# Patient Record
Sex: Female | Born: 1935 | Race: White | Hispanic: No | Marital: Single | Smoking: Never smoker
Health system: Southern US, Community
[De-identification: ages and names within clinical notes are randomized; demographics above are authoritative.]

## PROBLEM LIST (undated history)

## (undated) DIAGNOSIS — J45909 Unspecified asthma, uncomplicated: Secondary | ICD-10-CM

## (undated) DIAGNOSIS — H9190 Unspecified hearing loss, unspecified ear: Secondary | ICD-10-CM

## (undated) DIAGNOSIS — Z972 Presence of dental prosthetic device (complete) (partial): Secondary | ICD-10-CM

## (undated) DIAGNOSIS — M1990 Unspecified osteoarthritis, unspecified site: Secondary | ICD-10-CM

## (undated) DIAGNOSIS — F419 Anxiety disorder, unspecified: Secondary | ICD-10-CM

## (undated) HISTORY — PX: ORIF HIP FRACTURE: SHX2125

## (undated) HISTORY — PX: TOTAL HIP ARTHROPLASTY: SHX124

## (undated) HISTORY — PX: COLONOSCOPY: SHX174

## (undated) HISTORY — PX: APPENDECTOMY: SHX54

---

## 2004-08-30 ENCOUNTER — Ambulatory Visit: Payer: Self-pay | Admitting: Internal Medicine

## 2005-08-17 ENCOUNTER — Inpatient Hospital Stay: Payer: Self-pay | Admitting: Internal Medicine

## 2005-08-17 ENCOUNTER — Other Ambulatory Visit: Payer: Self-pay

## 2005-09-25 ENCOUNTER — Ambulatory Visit: Payer: Self-pay | Admitting: Internal Medicine

## 2005-11-05 ENCOUNTER — Ambulatory Visit: Payer: Self-pay

## 2006-07-19 ENCOUNTER — Ambulatory Visit: Payer: Self-pay | Admitting: Internal Medicine

## 2006-10-04 ENCOUNTER — Ambulatory Visit: Payer: Self-pay | Admitting: Internal Medicine

## 2007-03-31 ENCOUNTER — Inpatient Hospital Stay: Payer: Self-pay | Admitting: Internal Medicine

## 2007-05-19 ENCOUNTER — Ambulatory Visit: Payer: Self-pay | Admitting: Internal Medicine

## 2007-06-10 ENCOUNTER — Ambulatory Visit: Payer: Self-pay | Admitting: Gynecologic Oncology

## 2007-06-27 ENCOUNTER — Ambulatory Visit: Payer: Self-pay | Admitting: Unknown Physician Specialty

## 2007-10-07 ENCOUNTER — Ambulatory Visit: Payer: Self-pay | Admitting: Internal Medicine

## 2008-01-27 ENCOUNTER — Ambulatory Visit: Payer: Self-pay | Admitting: Gynecologic Oncology

## 2008-10-07 ENCOUNTER — Ambulatory Visit: Payer: Self-pay | Admitting: Internal Medicine

## 2010-11-23 ENCOUNTER — Ambulatory Visit: Payer: Self-pay | Admitting: Internal Medicine

## 2011-03-06 ENCOUNTER — Ambulatory Visit: Payer: Self-pay | Admitting: Urology

## 2011-12-25 ENCOUNTER — Ambulatory Visit: Payer: Self-pay | Admitting: Internal Medicine

## 2012-06-02 ENCOUNTER — Ambulatory Visit: Payer: Self-pay | Admitting: Internal Medicine

## 2012-06-28 ENCOUNTER — Ambulatory Visit: Payer: Self-pay | Admitting: Internal Medicine

## 2012-10-23 ENCOUNTER — Ambulatory Visit: Payer: Self-pay | Admitting: Obstetrics and Gynecology

## 2012-10-31 ENCOUNTER — Ambulatory Visit: Payer: Self-pay | Admitting: Obstetrics and Gynecology

## 2012-12-19 ENCOUNTER — Inpatient Hospital Stay: Payer: Self-pay | Admitting: Orthopedic Surgery

## 2012-12-19 LAB — BASIC METABOLIC PANEL
Anion Gap: 7 (ref 7–16)
Calcium, Total: 8.7 mg/dL (ref 8.5–10.1)
Chloride: 106 mmol/L (ref 98–107)
EGFR (African American): 56 — ABNORMAL LOW
EGFR (Non-African Amer.): 48 — ABNORMAL LOW
Glucose: 118 mg/dL — ABNORMAL HIGH (ref 65–99)
Sodium: 141 mmol/L (ref 136–145)

## 2012-12-19 LAB — URINALYSIS, COMPLETE
Bacteria: NONE SEEN
Blood: NEGATIVE
Glucose,UR: NEGATIVE mg/dL (ref 0–75)
Ketone: NEGATIVE
Nitrite: NEGATIVE
Protein: NEGATIVE
RBC,UR: 2 /HPF (ref 0–5)
Specific Gravity: 1.013 (ref 1.003–1.030)
Squamous Epithelial: <1
WBC UR: 1 /HPF (ref 0–5)

## 2012-12-19 LAB — CBC
HCT: 34.8 % — ABNORMAL LOW (ref 35.0–47.0)
HGB: 11.9 g/dL — ABNORMAL LOW (ref 12.0–16.0)
MCH: 30.1 pg (ref 26.0–34.0)
MCV: 88 fL (ref 80–100)
RBC: 3.95 10*6/uL (ref 3.80–5.20)
RDW: 13 % (ref 11.5–14.5)

## 2012-12-20 ENCOUNTER — Ambulatory Visit: Payer: Self-pay | Admitting: Orthopedic Surgery

## 2012-12-20 LAB — CBC WITH DIFFERENTIAL/PLATELET
Basophil %: 0.3 %
HCT: 32.7 % — ABNORMAL LOW (ref 35.0–47.0)
Monocyte #: 0.8 x10 3/mm (ref 0.2–0.9)
Monocyte %: 7.9 %
Neutrophil #: 8.7 10*3/uL — ABNORMAL HIGH (ref 1.4–6.5)
Neutrophil %: 81.3 %
RBC: 3.73 10*6/uL — ABNORMAL LOW (ref 3.80–5.20)
WBC: 10.7 10*3/uL (ref 3.6–11.0)

## 2012-12-20 LAB — BASIC METABOLIC PANEL
BUN: 23 mg/dL — ABNORMAL HIGH (ref 7–18)
Calcium, Total: 8.3 mg/dL — ABNORMAL LOW (ref 8.5–10.1)
Chloride: 104 mmol/L (ref 98–107)
Co2: 30 mmol/L (ref 21–32)
Creatinine: 0.98 mg/dL (ref 0.60–1.30)
EGFR (African American): >60
EGFR (Non-African Amer.): 53 — ABNORMAL LOW
Glucose: 142 mg/dL — ABNORMAL HIGH (ref 65–99)
Sodium: 137 mmol/L (ref 136–145)

## 2012-12-21 LAB — CBC WITH DIFFERENTIAL/PLATELET
Eosinophil %: 1 %
HCT: 26.2 % — ABNORMAL LOW (ref 35.0–47.0)
Lymphocyte #: 0.9 10*3/uL — ABNORMAL LOW (ref 1.0–3.6)
Lymphocyte %: 10.4 %
Monocyte %: 9.1 %
Neutrophil #: 6.8 10*3/uL — ABNORMAL HIGH (ref 1.4–6.5)
Neutrophil %: 79.3 %
RBC: 2.96 10*6/uL — ABNORMAL LOW (ref 3.80–5.20)
RDW: 13.2 % (ref 11.5–14.5)
WBC: 8.5 10*3/uL (ref 3.6–11.0)

## 2012-12-21 LAB — BASIC METABOLIC PANEL
Anion Gap: 4 — ABNORMAL LOW (ref 7–16)
BUN: 16 mg/dL (ref 7–18)
Calcium, Total: 7.9 mg/dL — ABNORMAL LOW (ref 8.5–10.1)
Chloride: 106 mmol/L (ref 98–107)
Osmolality: 280 (ref 275–301)
Potassium: 3.7 mmol/L (ref 3.5–5.1)

## 2012-12-22 LAB — CBC WITH DIFFERENTIAL/PLATELET
Basophil #: 0 10*3/uL (ref 0.0–0.1)
Basophil %: 0.5 %
Eosinophil #: 0.3 10*3/uL (ref 0.0–0.7)
Eosinophil %: 3.5 %
Lymphocyte #: 1.3 10*3/uL (ref 1.0–3.6)
MCHC: 34.7 g/dL (ref 32.0–36.0)
Monocyte #: 0.9 x10 3/mm (ref 0.2–0.9)
Neutrophil #: 6.7 10*3/uL — ABNORMAL HIGH (ref 1.4–6.5)
Neutrophil %: 72.4 %
RBC: 2.83 10*6/uL — ABNORMAL LOW (ref 3.80–5.20)

## 2012-12-22 LAB — BASIC METABOLIC PANEL
Anion Gap: 4 — ABNORMAL LOW (ref 7–16)
BUN: 18 mg/dL (ref 7–18)
Chloride: 105 mmol/L (ref 98–107)
EGFR (African American): 55 — ABNORMAL LOW
EGFR (Non-African Amer.): 47 — ABNORMAL LOW
Osmolality: 278 (ref 275–301)
Sodium: 138 mmol/L (ref 136–145)

## 2012-12-23 LAB — HEMOGLOBIN: HGB: 8.5 g/dL — ABNORMAL LOW (ref 12.0–16.0)

## 2012-12-24 ENCOUNTER — Encounter: Payer: Self-pay | Admitting: Internal Medicine

## 2012-12-26 ENCOUNTER — Encounter: Payer: Self-pay | Admitting: Internal Medicine

## 2013-01-26 ENCOUNTER — Encounter: Payer: Self-pay | Admitting: Internal Medicine

## 2013-02-07 LAB — BASIC METABOLIC PANEL
Anion Gap: 4 — ABNORMAL LOW (ref 7–16)
BUN: 28 mg/dL — ABNORMAL HIGH (ref 7–18)
Chloride: 106 mmol/L (ref 98–107)
Co2: 31 mmol/L (ref 21–32)
EGFR (African American): >60
EGFR (Non-African Amer.): >60
Glucose: 82 mg/dL (ref 65–99)
Potassium: 3.7 mmol/L (ref 3.5–5.1)
Sodium: 141 mmol/L (ref 136–145)

## 2013-02-07 LAB — CBC WITH DIFFERENTIAL/PLATELET
Basophil #: 0 10*3/uL (ref 0.0–0.1)
Basophil %: 0.7 %
Eosinophil #: 0.3 10*3/uL (ref 0.0–0.7)
HCT: 32 % — ABNORMAL LOW (ref 35.0–47.0)
HGB: 10.6 g/dL — ABNORMAL LOW (ref 12.0–16.0)
Lymphocyte %: 26.1 %
MCH: 29.2 pg (ref 26.0–34.0)
MCV: 88 fL (ref 80–100)
Monocyte %: 8.8 %
Neutrophil #: 4.2 10*3/uL (ref 1.4–6.5)
Neutrophil %: 60.3 %
RDW: 13.9 % (ref 11.5–14.5)
WBC: 7 10*3/uL (ref 3.6–11.0)

## 2013-02-09 LAB — OCCULT BLOOD X 1 CARD TO LAB, STOOL: Occult Blood, Feces: NEGATIVE

## 2014-09-17 NOTE — Op Note (Signed)
PATIENT NAME:  Lisa Gamble, Lisa Gamble MR#:  [id number] DATE OF BIRTH:  [birth year]  DATE OF PROCEDURE:  10/31/2012  PREOPERATIVE DIAGNOSIS: Moderate cystocele.   POSTOPERATIVE DIAGNOSIS: Moderate cystocele.   PROCEDURE: Anterior repair.   SURGEON: Ricky L. Amalia Hailey, M.D.   ASSISTANT: Scrub tech  ANESTHESIA: General endotracheal.   FINDINGS: Moderate cystocele, some cervical descensus with the patient not good candidate for hysterectomy.  ESTIMATED BLOOD LOSS: Minimal.   COMPLICATIONS: None.   SPECIMENS: None.   DRAINS: Foley.   PACKING: Present, soaked in Premarin cream.   ANTIBIOTICS: Yes, 1 gram Ancef IV preoperatively.   PROCEDURE IN DETAIL: The patient has had moderate cystocele with increasing nuisance-type symptoms, intolerant of pessary. After discussing the options elected repair. Consent was signed. She was taken to the operating room and placed in the supine position where anesthesia was initiated then placed in the dorsal lithotomy position using candy-cane stirrups, prepped and draped in the usual sterile fashion. Short weighted speculum was placed in the posterior vault. The patient was placed in Trendelenburg position.  Small Allis was placed approximately 3 cm in the midline from the external urethral orifice and then again toward the cephalad portion of the anterior defect. The area was injected with approximately 10 mL of dilute vasopressin, incised sharply in the midline through the dermis, using #10 blade. Alternating sharp and blunt dissection overlying epithelium was dissected free from bladder defect bilaterally. Pursestring suture of 3-0 Vicryl was placed and then there were approximating imbricating sutures placed, in interrupted mattress style. Excess skin was trimmed free and the skin was closed with a running interlocking 3-0 Vicryl.   Packing that had been soaked in Premarin cream was placed. Foley catheter was placed which showed good spillage of clear urine.  Packing was then tied to catheter, each to be removed in the morning.   The patient tolerated the  procedure well. She was returned to the supine position, left in the care of anesthesia. I anticipate a routine postoperative course. All instrument, needle and sponge counts were correct.  ____________________________ Audry Pili L. Amalia Hailey, MD rle:sb D: 10/31/2012 08:16:35 ET T: 10/31/2012 08:38:44 ET JOB#: [id number]  cc: Audry Pili L. Amalia Hailey, MD, <Dictator> Selmer Dominion MD ELECTRONICALLY SIGNED 11/01/2012 11:10

## 2014-09-17 NOTE — Discharge Summary (Signed)
PATIENT NAME:  Lisa Gamble, Lisa Gamble MR#:  [id number] DATE OF BIRTH:  1926-03-05  DATE OF ADMISSION:  12/19/2012 DATE OF DISCHARGE:  12/23/2012  ADMITTING DIAGNOSES:  1.  Displaced left femoral neck fracture.  2.  Grade 1 open comminuted intra-articular distal radius and distal ulna fracture.   DISCHARGE DIAGNOSES:  1.  Displaced left femoral neck fracture.  2.  Grade 1 open comminuted intra-articular distal radius and distal ulna fracture.   OPERATION: On 12/20/2012, the patient had a left hip hemiarthroplasty as well as an open reduction and internal fixation of the left distal radius fracture with irrigation and debridement and closure of the wound.   IMPLANTS: 1.  DePuy Summit size 5 femoral stem with a 47 mm bipolar head and a +1.5 neck length.  2.  [employer] distal radius volar locking plate with screws and pegs of appropriate length including DBX Putty.  BLOOD LOSS:  225 Ml.   TOURNIQUET TIME: 81 minutes.   ANESTHESIA: General.   COMPLICATIONS: None.   The patient was stabilized, brought to the recovery room, and then brought down to the orthopedic floor.   HISTORY OF PRESENT ILLNESS: The patient is an 81 year old female who presented for significant pain involving her left wrist and left hip. The patient fell on 12/19/2012 and  was brought to the Emergency Room where x-rays revealed a left femoral neck fracture and left distal radial comminuted open fracture. The patient had significant pain and could not ambulate.   PHYSICAL EXAMINATION: GENERAL: Well-developed, well-nourished female in mild discomfort with anxiety.  EXTREMITIES: The patient's left wrist, skin that shows 2 poke holes above the volar and radial aspect of the wrist. There is significant swelling noted. The patient does have gross instability at the wrist site. Left hip revealed skin that was intact. The patient had an external rotation and shortening. The patient did have good range of motion of the knee and  ankle, neurovascular intact.   HOSPITAL COURSE: After initial admission on 12/19/2012, the patient was brought to surgery the following day. On postoperative day 1, the patient had hemoglobin of 9.0, which dropped down to 8.6 on postoperative day 2 and down to 8.5 on postoperative day 3. The patient was doing physical therapy initially in bed. She then progressed up to ambulating 15 feet on the day before discharge.   CONDITION AT DISCHARGE: Stable.   DISPOSITION: The patient was sent to rehab.   DISCHARGE INSTRUCTIONS: The patient will follow up at [facility] in 2 weeks for staple removal. The patient is to do partial weight-bear on the affected leg and use  knee-high TED hose on both legs, to be removed 1 hour every 8 hour shift. The patient will have  her heels elevated off the bed and encouraged to do cough deep breathing. Diet is regular.  The patient will have dressing that is kept clean and dry and try not to get water under it. The patient will have a dressing change on a p.r.n. basis. The patient will have the rehab center call if there is any bright red bleeding, calf pain, bowel or bladder difficulty, or any fever greater than 101.5. The patient will do physical therapy and occupational therapy per protocol.   DISCHARGE MEDICATIONS:  Xanax 0.5 mg 1 tablet 4 times a day p.r.n., ProAir 90 mcg inhalation 2 puffs 4 times a day as needed, tramadol 50 mg 1 tablet b.i.d. for pain, temazepam 30 mg 1 capsule p.o. at bedtime, prednisone 20 mg  p.o. daily, Tylenol 325 mg 2 tablets every 4 hours as needed for pain or fever greater than 100.5, Percocet 1 tablet q. 4 hours as needed for severe pain, magnesium hydroxide 30 mL at bedtime for constipation, Lovenox 30 mg subcutaneous 2 times a day for 24 days, ferrous sulfate 325 mg 1 tablet p.o. b.i.d., bisacodyl 10 mg suppository rectally p.r.n., docusate calcium 240 mg p.o. daily, calcium and vitamin D 500/200 international unit 1 tablet  b.i.d.    ____________________________ Lenna Sciara. Reche Dixon, [place] jtm:dp D: 12/23/2012 07:25:15 ET T: 12/23/2012 07:37:11 ET JOB#: [id number]  cc: J. Reche Dixon, [place], <Dictator> J Tifanie Gardiner [facility] ELECTRONICALLY SIGNED 12/24/2012 7:37

## 2014-09-17 NOTE — Op Note (Signed)
PATIENT NAME:  Lisa Gamble, Lisa Gamble MR#:  [id number] DATE OF BIRTH:  1926/03/14  DATE OF PROCEDURE:  12/20/2012  ATTENDING SURGEON: Mali E. Chastin Riesgo, MD.   PREOPERATIVE DIAGNOSIS:  1.  Displaced left femoral neck fracture.  2.  Grade I open comminuted intra-articular distal radius and distal ulna fracture.   POSTOPERATIVE DIAGNOSIS: 1.  Displaced left femoral neck fracture.  2.  Grade I open comminuted intra-articular distal radius and distal ulna fracture.   PROCEDURE:  1.  Left hip hemiarthroplasty.  2.  Open reduction internal fixation of left distal radius fracture.  3.  Irrigation and excisional debridement down to and including subcutaneous tissue of grade I open distal radius and ulnar fracture with wound measurements of 5 mm x 5 mm.  4.  Simple closure one layer of grade I open distal radius and ulna fracture with wound measurements of 5 mm x 5 mm.   IMPLANTS:  1.  DePuy Summit size 5 femoral stem with 47 mm bipolar head and +1.5 neck length.  2.  [employer] distal radius volar locking plate with screws and pegs of appropriate length.  3. DBX putty  FLUIDS: Per anesthesia record.   BLOOD LOSS: 225.   URINE:  350.   TOURNIQUET TIME: 81 minutes.   ANESTHESIA: General.   COMPLICATIONS: None.  SPECIMENS: None.   INDICATIONS: This is the an 81 year old female who fell and sustained injury to her left wrist and left hip. She was found to have a displaced femoral neck fracture as well as a comminuted intra-articular distal radius fracture with 2 small poke-hole open areas on the volar aspect of her wrist. She was seen urgently in the Emergency Room where closed reduction as well as a thorough irrigation was performed. She was started on IV antibiotics in the Emergency Department. She was indicated for operative intervention in the form of a left hip hemiarthroplasty. Due to the comminuted nature of the fracture, but more importantly, the ability to have a weight-bearing  extremity for her postoperative rehab, it was felt that an ORIF was indicated for her distal radius fracture. All risks, benefits, and alternatives of the procedure were explained at length to the patient and her children and they wished to proceed. Informed consent was obtained.   PROCEDURE: The patient was seen in the preoperative holding area. The operative sites were marked by an operative surgeon. Preoperative antibiotics were administered. She also received preoperative antibiotics on the floor overnight. The patient was taken to the operating room where she is placed supine on the operating room table. General anesthesia was gently induced. She was then flipped to lateral position with the left hip up. All bony prominences were well padded. The left hip was isolated. It was prepped and draped in the usual sterile manner and a surgical timeout was performed. To begin, I made a posterior approach to the hip dissected through the skin to the IT band. This was split in line with our incision. We encountered the short external rotators which were tagged and released. The capsule was teed. This allowed [place] to visualize the fracture. Preliminary neck cut was made. The head was removed. In taking a look at the anterior and posterior aspects of the neck, there was some extension of the fracture down to the inferior aspect of the femoral neck so a secondary neck cut was made a bit lower. This was still proximal to the lesser trochanter. There was still a small area on the posterior aspect of the  neck of bone loss from the fracture site. There was no extension into the calcar. The integrity of the remaining femoral neck appeared to be intact. We then used our box osteotome followed by canal finder and broached up to a size 5. This allowed for a nice fit and feel. The head was measured and found to be a size 47. A 47 trial with a +1.5 neck was placed and allowed for adequate stability. Final components were opened. The  canal was thoroughly irrigated. We placed the size 5 stem without difficulty. We then placed the bipolar head and reduced the hip. Once again adequate stability was achieved. The wound was thoroughly irrigated. The capsule was closed. The piriformis was repaired back to the greater trochanter. The IT band was sutured. The skin was closed with 2-0 Vicryl followed by staples. Sterile dressings were applied. Drapes were removed. An abduction pillow was placed. The patient was then flipped to the supine position where again all bony prominences were well padded. The left upper extremity was placed on the arm board. Tourniquet was placed. The left upper extremity was then prepped and draped in the usual sterile manner. We made a volar approach to the radius directly over the FCR. We dissected through the soft tissue to the FCR sheath. This was incised. We made our way to the floor of the FCR down to the radial shaft. The pronator was elevated off the radial aspect of the distal radius. This allowed [place] to encounter our fracture site. The fracture was extremely comminuted with a large styloid piece as well as an impacted fragment from the lunate and some volar comminution. The volar locking plate was placed after an adequate reduction was achieved. We secured the plate down to the shaft with the slotted hole to allow for proper placement of the plate. Once the plate position was appropriate, it was secured with K wires. We checked the placement of the plate in both AP and lateral planes. Further reduction maneuvers were completed to allow for restoration of the normal anatomy. We then began placing the most distal screws from an ulnar to radial direction. The fracture fragments reduced nicely down to the plate. The articular surface in both the AP and lateral planes were appropriate. There is no penetration of the joint by any of the screws. We then placed the more proximal screws followed by the shaft screws. All  radiographs were confirmed and found to be appropriate with again no penetration of the articular surface and appropriate length of our screws. DBX putty was injected at the fracture site due to the comminution and bone loss. We then tacked down the pronator to the radial aspect of the distal radius top cover the plate. The skin was closed with 2-0 Vicryl followed by 4-0 nylon in a running mattress fashion. The 2 poke-hole open areas on the volar aspect of the distal radius were thoroughly irrigated with saline. All nonviable subcutaneous tissue was excised sharply with a pick-up and a knife. We then closed these 2 open areas with simple stitch using 4-0 nylon. Sterile dressings were applied as was a volar splint. Drapes were removed. The patient was awakened from general anesthesia without complication and transported to the recovery room in stable condition.   POSTOPERATIVE PLAN: The patient will be 50% weight-bearing to the left lower extremity due to the small breach of the posterior aspect of the femoral neck from the initial fracture. She will be nonweightbearing to the left wrist, however, she  can bear weight through her elbow and shoulder to allow her to use a platform walker. She will receive postoperative antibiotics. She will be placed on Lovenox for deep vein thrombosis prophylaxis. She will be seen and evaluated by physical therapy.     ____________________________ Mali E. Ransome Helwig, MD ces:dp D: 12/20/2012 14:05:32 ET T: 12/20/2012 14:52:11 ET JOB#: [id number]  cc: Mali E. Paz Fuentes, MD, <Dictator> [place] E Shantae Vantol MD ELECTRONICALLY SIGNED 12/21/2012 12:33

## 2014-09-17 NOTE — H&P (Signed)
PATIENT NAME:  Lisa Gamble, Lisa Gamble MR#:  [id number] DATE OF BIRTH:  [birth year]  DATE OF ADMISSION:  12/19/2012  CHIEF COMPLAINT:  Left wrist and left hip pain.   HISTORY OF PRESENT ILLNESS:  This is an 81 year old female who fell today and sustained injury to her left wrist and left hip. She was brought to the Emergency Room complaining of pain predominantly in the hip and thigh. She rates this pain as a 9/10, sharp in nature, exacerbated by movement of the lower extremity, and relieved by rest. She also had pain in her wrist, rated as a 7/10. She denies any numbness or tingling in the upper or lower extremities. She denies any injury to the right upper or right lower extremity, and she had no syncopal episodes, did not have loss of consciousness, and did not strike her head upon falling.   PAST MEDICAL HISTORY:  Anxiety, asthma.   PAST SURGICAL HISTORY:  Appendectomy and a bladder sling procedure.   MEDICATIONS:  Ultram, temazepam, ProAir, alprazolam.   ALLERGIES:  AMITRIPTYLINE, CODEINE, EFFEXOR, LITHIUM, TETRACYCLINE.   SOCIAL HISTORY:  The patient denies any alcohol or tobacco use.   FAMILY HISTORY:  Noncontributory.   REVIEW OF SYSTEMS:  No chest pain. No shortness of breath.   PHYSICAL EXAMINATION: GENERAL:  The patient is well-appearing, well-nourished, no acute distress, very anxious.  EXTREMITIES:  Examination of the left wrist reveals overlying skin to have 2 small poke-hole open areas along the volar and radial aspect of the wrist. There is mild soft tissue swelling. No erythema or ecchymosis. She has significant tenderness to palpation about the distal radius, with gross instability of the fracture site. She has full range of motion of all digits, intact sensation distally, good distal pulses, and good capillary refill. Examination of the right wrist was performed in a similar manner, and was free of any abnormalities. Examination of the left hip reveals overlying skin to be intact,  without erythema, tenderness to palpation over the greater trochanter, full range of motion of the ankle, 5/5 strength on dorsiflexion, plantarflexion and EHL function. Intact sensation distally, Good distal pulses.   IMAGES:  X-rays of the left hip and left wrist were obtained and reviewed, which reveals a displaced femoral neck fracture as well as a comminuted and displaced distal radius and ulnar fracture.   IMPRESSION:  An 81 year old female with displaced left femoral neck fracture and grade I open left distal radius and ulna fracture.   PLAN:  The patient will undergo closed reduction of the distal radius, along with an irrigation in the Emergency Department. She was started on IV Ancef immediately. She will undergo operative fixation of her left hip in the morning, which will be in the form of a hemiarthroplasty. She will likely undergo ORIF of the left distal radius. However, if closed reduction and casting allows for anatomic alignment, then that may be the treatment of choice. She will continue with her IV antibiotics. She will be given Valium for muscle spasms as well as some [place] traction. She will be n.p.o. after midnight.   PROCEDURE:  The patient was seen and identified. A timeout was performed. The patient was given sedation in the form of etomidate by the Emergency Room physician. Once appropriate sedation had been administered, a thorough irrigation of her grade I open fracture was carried out using saline and Betadine. Sterile dressings were applied. We then performed a closed reduction of her distal radius fracture, and we placed a sugar tong splint.  The patient tolerated this procedure well.    ____________________________ Mali E. Babe Anthis, MD ces:mr D: 12/19/2012 20:59:00 ET T: 12/19/2012 21:27:29 ET JOB#: [id number]  cc: Mali E. Glena Pharris, MD, <Dictator> [place] E Makani Seckman MD ELECTRONICALLY SIGNED 12/20/2012 7:42

## 2014-09-17 NOTE — Discharge Summary (Signed)
Dates of Admission and Diagnosis:  Date of Admission 31-Oct-2012   Date of Discharge 01-Nov-2012   Admitting Diagnosis cystocele   Final Diagnosis same    Chief Complaint/History of Present Illness cystocele, symptomatic   Hospital Course:  Hospital Course did well with ant colporrhaphy   Condition on Discharge Good   Physician's Instructions:  Home Health? No   Treatments None   Diet Regular   Activity Limitations no lift/strain   Return to Work after follow up visit with MD   Time frame for Follow Up Appointment 2-4 weeks   Electronic Signatures: Edison Nasuti (MD)  (Signed 07-Jun-14 11:15)  Authored: ADMISSION DATE AND DIAGNOSIS, CHIEF COMPLAINT/HPI, HOSPITAL COURSE, PATIENT INSTRUCTIONS   Last Updated: 07-Jun-14 11:15 by Edison Nasuti (MD)

## 2014-09-17 NOTE — Consult Note (Signed)
Brief Consult Note: Diagnosis: 1. Pre-operative med. eval 2. Asthma 3. Anxiety 4. Osteoporosis.   Patient was seen by consultant.   Consult note dictated.   Orders entered.   Discussed with Attending MD.   Comments: 81 yo female w/ hx of Asthma, Anxiety, Hyperlipidemia, hx of SVT, Osteoporosis came into hospital after a mechanical fall and noted to have left hip fracture and left wrist fracture.   1. Pre-operative evaluation - pt. is likely a moderate risk for non-cardiac surgery.  - no contraindications to surgery at this time.  - ECG reviewed and shows no acute ST changes.   2. Anxiety - cont. PRN Xanax.   3. Asthma - no acute exacerbation.  - cont. PrN albuterol.   thanks for the consult and will follow with you.  Full Code  job # [id number].  Electronic Signatures: Henreitta Leber (MD)  (Signed 25-Jul-14 18:51)  Authored: Brief Consult Note   Last Updated: 25-Jul-14 18:51 by Henreitta Leber (MD)

## 2014-09-17 NOTE — Consult Note (Signed)
PATIENT NAME:  Lisa Gamble, Lisa Gamble MR#:  [id number] DATE OF BIRTH:  [birth year]  DATE OF CONSULTATION:  12/19/2012  REFERRING PHYSICIAN: Dawayne Patricia, MD.  CONSULTING PHYSICIAN:  Belia Heman. Verdell Carmine, MD  PRIMARY CARE PHYSICIAN: Emily Filbert, MD.  REASON FOR CONSULTATION: Preoperative medical evaluation and medical management.   HISTORY OF PRESENT ILLNESS: This is an 81 year old female who presents to the hospital after suffering a mechanical fall in her backyard and noted to have a left hip and left wrist fracture. Hospitalist services were contacted for preoperative medical evaluation and management. The patient denied any prodromal symptoms prior to her fall like any chest pain, shortness of breath, nausea, vomiting, palpitations, any true syncope or seizure-type activity. The patient says she tripped over her shoe and then fell to the ground in her backyard.   REVIEW OF SYSTEMS:  CONSTITUTIONAL: No documented fever. No weight gain. No weight loss.  EYES: No blurred or double vision.  ENT: No tinnitus. No postnasal drip. No redness of the oropharynx.  RESPIRATORY: No cough. No wheeze. No hemoptysis. No dyspnea.  CARDIOVASCULAR: No chest pain. No orthopnea. No palpitations or syncope.  GASTROINTESTINAL: No nausea. No vomiting. No diarrhea. No abdominal pain. No melena or hematochezia.  GENITOURINARY: No dysuria. No hematuria.  ENDOCRINE: No polyuria or nocturia. No heat or cold intolerance.  HEMATOLOGIC: No anemia. No bruising. No bleeding.  INTEGUMENTARY: No rashes. No lesions.  MUSCULOSKELETAL: No arthritis. No swelling. No gout.  NEUROLOGIC: No numbness or tingling. No ataxia. No seizure-type activity.  PSYCHIATRIC: Positive anxiety. No insomnia. No ADD.   PAST MEDICAL HISTORY: Consistent with anxiety, diverticulosis, hyperlipidemia, history of SVT, chronic stable asthma.   PAST SURGICAL HISTORY: Consistent with appendectomy and breast biopsy.   SOCIAL HISTORY: Used to be a  smoker, quit many, many years ago. No alcohol abuse. No illicit drug abuse. She lives by herself.   FAMILY HISTORY: Mother died at age 77 of old age. Father died from a suicide.   CURRENT MEDICATIONS: As follows: Xanax 0.5 mg 4 times daily as needed, prednisone 20 mg daily as needed, tramadol 50 mg b.i.d. as needed, albuterol inhaler 1 to 2 puffs every 4 hours as needed, temazepam 30 mg at bedtime.   ALLERGIES: AMITRIPTYLINE, CODEINE, EFFEXOR, LITHIUM, AND TETRACYCLINE.   ADMISSION PHYSICAL EXAMINATION:  VITAL SIGNS: Temperature is 98.3. Pulse 87, respirations 18, blood pressure 172/82. Saturations  98% on 2 liters nasal cannula.  GENERAL: She is a pleasant-appearing female in some pain.  HEENT: Atraumatic, normocephalic. Her extraocular muscles are intact. Her pupils equal and reactive to light. Her sclerae anicteric. No conjunctival injection. No oropharyngeal erythema.  NECK: Supple. There is no jugular venous distention. No bruits, no lymphadenopathy or thyromegaly.  HEART: Regular rate and rhythm. No murmurs. No rubs. No clicks.  LUNGS: Clear to auscultation bilaterally. No rales. No rhonchi. No wheezes. No dullness to percussion. Negative use of accessory muscles.  ABDOMEN: Soft, flat, nontender, nondistended. Has good bowel sounds. No hepatosplenomegaly appreciated.  EXTREMITIES: No evidence of any cyanosis, clubbing or peripheral edema. Has +2 pedal and radial pulses bilaterally. Her left lower extremity is externally rotated and shortened due to her fracture. Her left upper extremity is in a splint.  SKIN: Moist and warm with no rashes.  LYMPHATIC: There is no cervical or axillary lymphadenopathy.  NEUROLOGICAL: Alert, awake and oriented x3 with no focal motor or sensory deficits appreciated bilaterally.   LABORATORY DATA: Serum glucose of 118, BUN 25, creatinine 1.05, sodium 141, potassium 4, chloride  106, bicarbonate 28. The patient's white cell count 8.8, hemoglobin 11.9, hematocrit  34.8, platelet count 234. Urinalysis is within normal limits.   The patient did have an x-ray of her left wrist showing angulated displaced comminuted distal radial and ulnar fractures. An x-ray of her left femur showing left femoral neck acute overriding fracture. She also had a chest x-ray done which showed no acute abnormality.   ASSESSMENT AND PLAN: This is an 81 year old female with history of asthma, history of anxiety, hyperlipidemia, history of supraventricular tachycardia, osteoporosis, who presents to the hospital after a mechanical fall and noted to have a left hip and a left wrist fracture.   1.  Preoperative evaluation. The patient is likely a moderate risk for noncardiac surgery. There are no absolute contraindications to surgery at this time. The patient's EKG has been reviewed  which showed no acute ST-T-wave changes.  2.  Anxiety. Would continue with her p.r.n. Xanax.  3.  Asthma. There is no evidence of acute asthma exacerbation. Continue with albuterol inhaler. If needed, will use nebulizers.   Thank you so much for the consultation. We will follow along with you.   CODE STATUS: FULL CODE.   Time spent on consult is 45 minutes.   The patient will be transferred over to Dr. Myrtis Ser service.    ____________________________ Belia Heman. Verdell Carmine, MD vjs:np D: 12/19/2012 18:51:45 ET T: 12/19/2012 22:05:24 ET JOB#: [id number]  cc: Belia Heman. Verdell Carmine, MD, <Dictator> Henreitta Leber MD ELECTRONICALLY SIGNED 12/27/2012 15:13

## 2015-03-21 DIAGNOSIS — M170 Bilateral primary osteoarthritis of knee: Secondary | ICD-10-CM | POA: Insufficient documentation

## 2015-07-25 ENCOUNTER — Other Ambulatory Visit: Payer: Self-pay | Admitting: Internal Medicine

## 2015-07-25 DIAGNOSIS — M5116 Intervertebral disc disorders with radiculopathy, lumbar region: Secondary | ICD-10-CM

## 2015-08-12 ENCOUNTER — Ambulatory Visit
Admission: RE | Admit: 2015-08-12 | Discharge: 2015-08-12 | Disposition: A | Discharge status: Home / Self Care | Payer: Medicare Other | Source: Ambulatory Visit | Attending: Internal Medicine | Admitting: Internal Medicine

## 2015-08-12 DIAGNOSIS — M5116 Intervertebral disc disorders with radiculopathy, lumbar region: Secondary | ICD-10-CM | POA: Insufficient documentation

## 2015-08-12 DIAGNOSIS — M4696 Unspecified inflammatory spondylopathy, lumbar region: Secondary | ICD-10-CM | POA: Diagnosis not present

## 2015-08-12 DIAGNOSIS — M4806 Spinal stenosis, lumbar region: Secondary | ICD-10-CM | POA: Insufficient documentation

## 2016-07-03 DIAGNOSIS — E782 Mixed hyperlipidemia: Secondary | ICD-10-CM | POA: Insufficient documentation

## 2017-07-12 DIAGNOSIS — J4530 Mild persistent asthma, uncomplicated: Secondary | ICD-10-CM | POA: Insufficient documentation

## 2017-12-19 ENCOUNTER — Encounter: Payer: Self-pay | Admitting: *Deleted

## 2017-12-19 ENCOUNTER — Other Ambulatory Visit: Payer: Self-pay

## 2017-12-25 NOTE — Discharge Instructions (Signed)
INSTRUCTIONS FOLLOWING OCULOPLASTIC SURGERY °AMY M. FOWLER, MD ° °AFTER YOUR EYE SURGERY, THER ARE MANY THINGS THWIHC YOU, THE PATIENT, CAN DO TO ASSURE THE BEST POSSIBLE RESULT FROM YOUR OPERATION.  THIS SHEET SHOULD BE REFERRED TO WHENEVER QUESTIONS ARISE.  IF THERE ARE ANY QUESTIONS NOT ANSWERED HERE, DO NOT HESITATE TO CALL OUR OFFICE AT [phone] OR [phone].  THERE IS ALWAYS [facility] AVAILABLE TO CALL IF QUESTIONS OR PROBLEMS ARISE. ° °VISION: Your vision may be blurred and out of focus after surgery until you are able to stop using your ointment, swelling resolves and your eye(s) heal. This may take 1 to 2 weeks at the least.  If your vision becomes gradually more dim or dark, this is not normal and you need to call our office immediately. ° °EYE CARE: For the first 48 hours after surgery, use ice packs frequently - “20 minutes on, 20 minutes off” - to help reduce swelling and bruising.  Small bags of frozen peas or corn make good ice packs along with cloths soaked in ice water.  If you are wearing a patch or other type of dressing following surgery, keep this on for the amount of time specified by your doctor.  For the first week following surgery, you will need to treat your stitches with great care.  If is OK to shower, but take care to not allow soapy water to run into your eye(s) to help reduce changes of infection.  You may gently clean the eyelashes and around the eye(s) with cotton balls and sterile water, BUT DO NOT RUB THE STITCHES VIGOROUSLY.  Keeping your stitches moist with ointment will help promote healing with minimal scar formation. ° °ACTIVITY: When you leave the surgery center, you should go home, rest and be inactive.  The eye(s) may feel scratchy and keeping the eyes closed will allow for faster healing.  The first week following surgery, avoid straining (anything making the face turn red) or lifting over 20 pounds.  Additionally, avoid bending which causes your head to go below  your waist.  Using your eyes will NOT harm them, so feel free to read, watch television, use the computer, etc as desired.  Driving depends on each individual, so check with your doctor if you have questions about driving. ° °MEDICATIONS:  You will be given a prescription for an ointment to use 4 times a day on your stitches.  You can use the ointment in your eyes if they feel scratchy or irritated.  If you eyelid(s) don’t close completely when you sleep, put some ointment in your eyes before bedtime. ° °EMERGENCY: If you experience SEVERE EYE PAIN OR HEADACHE UNRELIEVED BY TYLENOL OR PERCOCET, NAUSEA OR VOMITING, WORSENING REDNESS, OR WORSENING VISION (ESPECIALLY VISION THAT WA INITIALLY BETTER) CALL [phone] OR [phone] DURING BUSINESS HOURS OR AFTER HOURS. ° °General Anesthesia, Adult, Care After °These instructions provide you with information about caring for yourself after your procedure. Your health care provider may also give you more specific instructions. Your treatment has been planned according to current medical practices, but problems sometimes occur. Call your health care provider if you have any problems or questions after your procedure. °What can I expect after the procedure? °After the procedure, it is common to have: °· Vomiting. °· A sore throat. °· Mental slowness. ° °It is common to feel: °· Nauseous. °· Cold or shivery. °· Sleepy. °· Tired. °· Sore or achy, even in parts of your body where you did not have surgery. ° °  Follow these instructions at home: °For at least 24 hours after the procedure: °· Do not: °? Participate in activities where you could fall or become injured. °? Drive. °? Use heavy machinery. °? Drink alcohol. °? Take sleeping pills or medicines that cause drowsiness. °? Make important decisions or sign legal documents. °? Take care of children on your own. °· Rest. °Eating and drinking °· If you vomit, drink water, juice, or soup when you can drink without  vomiting. °· Drink enough fluid to keep your urine clear or pale yellow. °· Make sure you have little or no nausea before eating solid foods. °· Follow the diet recommended by your health care provider. °General instructions °· Have a responsible adult stay with you until you are awake and alert. °· Return to your normal activities as told by your health care provider. Ask your health care provider what activities are safe for you. °· Take over-the-counter and prescription medicines only as told by your health care provider. °· If you smoke, do not smoke without supervision. °· Keep all follow-up visits as told by your health care provider. This is important. °Contact a health care provider if: °· You continue to have nausea or vomiting at home, and medicines are not helpful. °· You cannot drink fluids or start eating again. °· You cannot urinate after 8-12 hours. °· You develop a skin rash. °· You have fever. °· You have increasing redness at the site of your procedure. °Get help right away if: °· You have difficulty breathing. °· You have chest pain. °· You have unexpected bleeding. °· You feel that you are having a life-threatening or urgent problem. °This information is not intended to replace advice given to you by your health care provider. Make sure you discuss any questions you have with your health care provider. °Document Released: 08/20/2000 Document Revised: 10/17/2015 Document Reviewed: 04/28/2015 °Elsevier Interactive Patient Education © 2018 [employer] ° °

## 2017-12-31 ENCOUNTER — Encounter
Admission: RE | Disposition: A | Discharge status: Home / Self Care | Payer: Self-pay | Source: Ambulatory Visit | Attending: Ophthalmology

## 2017-12-31 ENCOUNTER — Ambulatory Visit: Payer: Medicare Other | Admitting: Anesthesiology

## 2017-12-31 ENCOUNTER — Ambulatory Visit
Admission: RE | Admit: 2017-12-31 | Discharge: 2017-12-31 | Disposition: A | Discharge status: Home / Self Care | Payer: Medicare Other | Source: Ambulatory Visit | Attending: Ophthalmology | Admitting: Ophthalmology

## 2017-12-31 DIAGNOSIS — N329 Bladder disorder, unspecified: Secondary | ICD-10-CM | POA: Diagnosis not present

## 2017-12-31 DIAGNOSIS — J45909 Unspecified asthma, uncomplicated: Secondary | ICD-10-CM | POA: Diagnosis not present

## 2017-12-31 DIAGNOSIS — Z885 Allergy status to narcotic agent status: Secondary | ICD-10-CM | POA: Diagnosis not present

## 2017-12-31 DIAGNOSIS — K6389 Other specified diseases of intestine: Secondary | ICD-10-CM | POA: Insufficient documentation

## 2017-12-31 DIAGNOSIS — F419 Anxiety disorder, unspecified: Secondary | ICD-10-CM | POA: Insufficient documentation

## 2017-12-31 DIAGNOSIS — H9190 Unspecified hearing loss, unspecified ear: Secondary | ICD-10-CM | POA: Insufficient documentation

## 2017-12-31 DIAGNOSIS — H02403 Unspecified ptosis of bilateral eyelids: Secondary | ICD-10-CM | POA: Insufficient documentation

## 2017-12-31 DIAGNOSIS — F329 Major depressive disorder, single episode, unspecified: Secondary | ICD-10-CM | POA: Insufficient documentation

## 2017-12-31 DIAGNOSIS — Z96649 Presence of unspecified artificial hip joint: Secondary | ICD-10-CM | POA: Diagnosis not present

## 2017-12-31 DIAGNOSIS — M1990 Unspecified osteoarthritis, unspecified site: Secondary | ICD-10-CM | POA: Diagnosis not present

## 2017-12-31 DIAGNOSIS — R002 Palpitations: Secondary | ICD-10-CM | POA: Diagnosis not present

## 2017-12-31 HISTORY — DX: Unspecified asthma, uncomplicated: J45.909

## 2017-12-31 HISTORY — DX: Unspecified osteoarthritis, unspecified site: M19.90

## 2017-12-31 HISTORY — PX: PTOSIS REPAIR: SHX6568

## 2017-12-31 HISTORY — DX: Unspecified hearing loss, unspecified ear: H91.90

## 2017-12-31 HISTORY — DX: Presence of dental prosthetic device (complete) (partial): Z97.2

## 2017-12-31 HISTORY — DX: Anxiety disorder, unspecified: F41.9

## 2017-12-31 SURGERY — REPAIR, BLEPHAROPTOSIS
Anesthesia: Monitor Anesthesia Care | Site: Eye | Laterality: Bilateral | Wound class: Clean

## 2017-12-31 MED ORDER — ONDANSETRON HCL 4 MG/2ML IJ SOLN: 4.0000 mg | Freq: Once | INTRAMUSCULAR | Status: DC | PRN

## 2017-12-31 MED ORDER — LIDOCAINE-EPINEPHRINE 2 %-1:100000 IJ SOLN
INTRAMUSCULAR | Status: DC | PRN
  Administered 2017-12-31: 1 mL via OPHTHALMIC

## 2017-12-31 MED ORDER — DEXMEDETOMIDINE HCL 200 MCG/2ML IV SOLN
INTRAVENOUS | Status: DC | PRN
  Administered 2017-12-31 (×3): 4 ug via INTRAVENOUS

## 2017-12-31 MED ORDER — ALFENTANIL 500 MCG/ML IJ INJ
INJECTION | INTRAVENOUS | Status: DC | PRN
  Administered 2017-12-31: 200 ug via INTRAVENOUS
  Administered 2017-12-31: 500 ug via INTRAVENOUS

## 2017-12-31 MED ORDER — ERYTHROMYCIN 5 MG/GM OP OINT
TOPICAL_OINTMENT | OPHTHALMIC | Status: DC | PRN
  Administered 2017-12-31: 1 via OPHTHALMIC

## 2017-12-31 MED ORDER — BSS IO SOLN
INTRAOCULAR | Status: DC | PRN
  Administered 2017-12-31: 3 mL

## 2017-12-31 MED ORDER — TETRACAINE HCL 0.5 % OP SOLN
OPHTHALMIC | Status: DC | PRN
  Administered 2017-12-31: 2 [drp] via OPHTHALMIC

## 2017-12-31 MED ORDER — LACTATED RINGERS IV SOLN
10.0000 mL/h | INTRAVENOUS | Status: DC
  Administered 2017-12-31: 11:00:00 via INTRAVENOUS

## 2017-12-31 MED ORDER — ERYTHROMYCIN 5 MG/GM OP OINT: TOPICAL_OINTMENT | OPHTHALMIC | 2 refills | Status: DC

## 2017-12-31 SURGICAL SUPPLY — 34 items
APPLICATOR COTTON TIP WD 3 STR (MISCELLANEOUS) ×6 IMPLANT
BLADE SURG 15 STRL LF DISP TIS (BLADE) ×1 IMPLANT
BLADE SURG 15 STRL SS (BLADE) ×2
CORD BIP STRL DISP 12FT (MISCELLANEOUS) ×3 IMPLANT
DRAPE HEAD BAR (DRAPES) ×3 IMPLANT
GAUZE SPONGE 4X4 12PLY STRL (GAUZE/BANDAGES/DRESSINGS) ×3 IMPLANT
GAUZE SPONGE NON-WVN 2X2 STRL (MISCELLANEOUS) ×10 IMPLANT
GLOVE SURG LX 7.0 MICRO (GLOVE) ×4
GLOVE SURG LX STRL 7.0 MICRO (GLOVE) ×2 IMPLANT
MARKER SKIN XFINE TIP W/RULER (MISCELLANEOUS) ×3 IMPLANT
NEEDLE FILTER BLUNT 18X 1/2SAF (NEEDLE) ×2
NEEDLE FILTER BLUNT 18X1 1/2 (NEEDLE) ×1 IMPLANT
NEEDLE HYPO 30X.5 LL (NEEDLE) ×6 IMPLANT
PACK DRAPE NASAL/ENT (PACKS) ×3 IMPLANT
SOL PREP PVP 2OZ (MISCELLANEOUS) ×3
SOLUTION PREP PVP 2OZ (MISCELLANEOUS) ×1 IMPLANT
SPONGE VERSALON 2X2 STRL (MISCELLANEOUS) ×20
SUT CHROMIC 4-0 (SUTURE)
SUT CHROMIC 4-0 M2 12X2 ARM (SUTURE)
SUT CHROMIC 5 0 P 3 (SUTURE) IMPLANT
SUT ETHILON 4 0 CL P 3 (SUTURE) IMPLANT
SUT MERSILENE 4-0 S-2 (SUTURE) IMPLANT
SUT PDS AB 4-0 P3 18 (SUTURE) IMPLANT
SUT PLAIN GUT (SUTURE) ×3 IMPLANT
SUT PROLENE 5 0 P 3 (SUTURE) IMPLANT
SUT PROLENE 6 0 P 1 18 (SUTURE) ×6 IMPLANT
SUT SILK 4 0 G 3 (SUTURE) IMPLANT
SUT VIC AB 5-0 P-3 18X BRD (SUTURE) IMPLANT
SUT VIC AB 5-0 P3 18 (SUTURE)
SUT VICRYL 7 0 TG140 8 (SUTURE) IMPLANT
SUTURE CHRMC 4-0 M2 12X2 ARM (SUTURE) IMPLANT
SYR 10ML LL (SYRINGE) ×3 IMPLANT
SYR 3ML LL SCALE MARK (SYRINGE) ×3 IMPLANT
WATER STERILE IRR 250ML POUR (IV SOLUTION) ×3 IMPLANT

## 2017-12-31 NOTE — H&P (Signed)
See the history and physical completed at [facility] on 12/17/17 and scanned into the chart.

## 2017-12-31 NOTE — Anesthesia Procedure Notes (Signed)
Procedure Name: MAC Date/Time: 12/31/2017 10:57 AM Performed by: Lind Guest, CRNA Pre-anesthesia Checklist: Patient identified, Emergency Drugs available, Suction available, Patient being monitored and Timeout performed Patient Re-evaluated:Patient Re-evaluated prior to induction Oxygen Delivery Method: Nasal cannula

## 2017-12-31 NOTE — Transfer of Care (Signed)
Immediate Anesthesia Transfer of Care Note  Patient: Lisa Gamble  Procedure(s) Performed: PTOSIS REPAIR RESECT EX (Bilateral Eye)  Patient Location: PACU  Anesthesia Type: MAC  Level of Consciousness: awake, alert  and patient cooperative  Airway and Oxygen Therapy: Patient Spontanous Breathing and Patient connected to supplemental oxygen  Post-op Assessment: Post-op Vital signs reviewed, Patient's Cardiovascular Status Stable, Respiratory Function Stable, Patent Airway and No signs of Nausea or vomiting  Post-op Vital Signs: Reviewed and stable  Complications: No apparent anesthesia complications

## 2017-12-31 NOTE — Op Note (Signed)
Preoperative Diagnosis:   Visually significant blepharoptosis bilateral upper Eyelid(s)   Postoperative Diagnosis:  Same.  Procedure(s) Performed:    Blepharoptosis repair with levator aponeurosis advancement bilateral upper Eyelid(s)   Surgeon: Philis Pique. Vickki Muff, M.D.  Assistants: none  Anesthesia: MAC  Specimens: None.  Estimated Blood Loss: Minimal.  Complications: None.  Operative Findings: None Dictated  Procedure:   Allergies were reviewed and the patient Codeine and Tramadol.   After the risks, benefits, complications and alternatives were discussed with the patient, appropriate informed consent was obtained.  While seated in an upright position and looking in primary gaze, the mid pupillary line was marked on the upper eyelid margins bilaterally. The patient was then brought to the operating suite and reclined supine.  Timeout was conducted and the patient was sedated.  Local anesthetic consisting of a 50-50 mixture of 2% lidocaine with epinephrine and 0.75% bupivacaine with added Hylenex was injected subcutaneously to both upper eyelid(s). After adequate local was instilled, the patient was prepped and draped in the usual sterile fashion for eyelid surgery.   Attention was turned to the upper eyelids. A 71m upper eyelid crease incision line was marked with calipers on both upper eyelid(s).  A pinch test was used to estimate the amount of excess skin to remove and this was marked in standard blepharoplasty style fashion. Attention was turned to the right upper eyelid. A #15 blade was used to open the premarked incision line. A skin only flap was excised and hemostasis was obtained with bipolar cautery.   Westcott scissors were then used to transect through orbicularis down to the tarsal plate. Epitarsus was dissected to create a smooth surface to suture to. Dissection was then carried superiorly in the plane between orbicularis and orbital septum. Once the preaponeurotic fat  pocket was identified, the orbital septum was opened. This revealed the levator and its aponeurosis.    Attention was then turned to the opposite eyelid where the same procedure was performed in the same manner. Hemostasis was obtained with bipolar cautery throughout.   3 interrupted 6-0 Prolene sutures were then passed partial thickness through the tarsal plates of both upper eyelid(s). These sutures were placed in line with the mid pupillary, medial limbal, and lateral limbal lines. The sutures were fixed to the levator aponeurosis and adjusted until a nice lid height and contour were achieved. Once nice symmetry was achieved, the skin incisions were closed with a running 6-0 fast absorbing plain suture. The patient tolerated the procedure well.  Erythromycin ophthalmic ointment was applied to the incision site(s) followed by ice packs. The patient was taken to the recovery area where she recovered without difficulty.  Post-Op Plan/Instructions:  The patient was instructed to use ice packs frequently for the next 48 hours. She was instructed to use erythromycin ophthalmic ointment on her incisions 4 times a day for the next 12 to 14 days. She was instructed to use Tylenol for pain control. [facility] [place] [place] [facility] or sooner as needed for problems.  Naleyah Ohlinger M. FVickki Muff M.D. Attending,Ophthalmology

## 2017-12-31 NOTE — Anesthesia Postprocedure Evaluation (Signed)
Anesthesia Post Note  Patient: Lisa Gamble  Procedure(s) Performed: PTOSIS REPAIR RESECT EX (Bilateral Eye)  Patient location during evaluation: PACU Anesthesia Type: MAC Level of consciousness: awake and alert, oriented and patient cooperative Pain management: pain level controlled Vital Signs Assessment: post-procedure vital signs reviewed and stable Respiratory status: spontaneous breathing, nonlabored ventilation and respiratory function stable Cardiovascular status: blood pressure returned to baseline and stable Postop Assessment: adequate PO intake Anesthetic complications: no    Darrin Nipper

## 2017-12-31 NOTE — Interval H&P Note (Signed)
History and Physical Interval Note:  12/31/2017 10:24 AM  Lisa Gamble  has presented today for surgery, with the diagnosis of H02.403 PTOSIS OF EYELID UNSPECIFIED  The various methods of treatment have been discussed with the patient and family. After consideration of risks, benefits and other options for treatment, the patient has consented to  Procedure(s): PTOSIS REPAIR RESECT EX (Bilateral) as a surgical intervention .  The patient's history has been reviewed, patient examined, no change in status, stable for surgery.  I have reviewed the patient's chart and labs.  Questions were answered to the patient's satisfaction.     Vickki Muff, Glorianne Proctor M

## 2017-12-31 NOTE — Anesthesia Preprocedure Evaluation (Addendum)
Anesthesia Evaluation  Patient identified by MRN, date of birth, ID band Patient awake    Reviewed: Allergy & Precautions, NPO status , Patient's Chart, lab work & pertinent test results  History of Anesthesia Complications Negative for: history of anesthetic complications  Airway Mallampati: IV   Neck ROM: Limited  Mouth opening: Limited Mouth Opening  Dental  (+) Lower Dentures, Upper Dentures   Pulmonary asthma ,    Pulmonary exam normal breath sounds clear to auscultation       Cardiovascular Exercise Tolerance: Good negative cardio ROS Normal cardiovascular exam Rhythm:Regular Rate:Normal     Neuro/Psych PSYCHIATRIC DISORDERS Anxiety HOH; ambulates with cane    GI/Hepatic negative GI ROS,   Endo/Other  negative endocrine ROS  Renal/GU negative Renal ROS     Musculoskeletal  (+) Arthritis ,   Abdominal   Peds  Hematology negative hematology ROS (+)   Anesthesia Other Findings   Reproductive/Obstetrics                             Anesthesia Physical Anesthesia Plan  ASA: III  Anesthesia Plan: MAC   Post-op Pain Management:    Induction: Intravenous  PONV Risk Score and Plan: 3 and TIVA and Treatment may vary due to age or medical condition  Airway Management Planned: Natural Airway  Additional Equipment:   Intra-op Plan:   Post-operative Plan:   Informed Consent: I have reviewed the patients History and Physical, chart, labs and discussed the procedure including the risks, benefits and alternatives for the proposed anesthesia with the patient or authorized representative who has indicated his/her understanding and acceptance.     Plan Discussed with: CRNA  Anesthesia Plan Comments:        Anesthesia Quick Evaluation

## 2018-01-01 ENCOUNTER — Encounter: Payer: Self-pay | Admitting: Ophthalmology

## 2021-01-20 ENCOUNTER — Inpatient Hospital Stay: Payer: Medicare Other | Admitting: Anesthesiology

## 2021-01-20 ENCOUNTER — Emergency Department: Payer: Medicare Other

## 2021-01-20 ENCOUNTER — Inpatient Hospital Stay
Admission: EM | Admit: 2021-01-20 | Discharge: 2021-01-27 | DRG: 853 | Disposition: A | Discharge status: Skilled Nursing Facility | Payer: Medicare Other | Source: Skilled Nursing Facility | Attending: Internal Medicine | Admitting: Internal Medicine

## 2021-01-20 ENCOUNTER — Encounter
Admission: EM | Disposition: A | Discharge status: Skilled Nursing Facility | Payer: Self-pay | Source: Skilled Nursing Facility | Attending: Internal Medicine

## 2021-01-20 ENCOUNTER — Encounter: Payer: Self-pay | Admitting: Internal Medicine

## 2021-01-20 ENCOUNTER — Inpatient Hospital Stay: Payer: Medicare Other

## 2021-01-20 ENCOUNTER — Other Ambulatory Visit: Payer: Self-pay

## 2021-01-20 DIAGNOSIS — N200 Calculus of kidney: Secondary | ICD-10-CM | POA: Diagnosis not present

## 2021-01-20 DIAGNOSIS — R1084 Generalized abdominal pain: Secondary | ICD-10-CM

## 2021-01-20 DIAGNOSIS — I471 Supraventricular tachycardia: Secondary | ICD-10-CM | POA: Diagnosis present

## 2021-01-20 DIAGNOSIS — E440 Moderate protein-calorie malnutrition: Secondary | ICD-10-CM | POA: Diagnosis present

## 2021-01-20 DIAGNOSIS — G9341 Metabolic encephalopathy: Secondary | ICD-10-CM | POA: Diagnosis not present

## 2021-01-20 DIAGNOSIS — Z96642 Presence of left artificial hip joint: Secondary | ICD-10-CM | POA: Diagnosis present

## 2021-01-20 DIAGNOSIS — E872 Acidosis: Secondary | ICD-10-CM | POA: Diagnosis present

## 2021-01-20 DIAGNOSIS — N136 Pyonephrosis: Secondary | ICD-10-CM | POA: Diagnosis present

## 2021-01-20 DIAGNOSIS — R739 Hyperglycemia, unspecified: Secondary | ICD-10-CM | POA: Diagnosis present

## 2021-01-20 DIAGNOSIS — G928 Other toxic encephalopathy: Secondary | ICD-10-CM | POA: Diagnosis present

## 2021-01-20 DIAGNOSIS — R4182 Altered mental status, unspecified: Secondary | ICD-10-CM | POA: Diagnosis not present

## 2021-01-20 DIAGNOSIS — A419 Sepsis, unspecified organism: Secondary | ICD-10-CM | POA: Diagnosis not present

## 2021-01-20 DIAGNOSIS — I255 Ischemic cardiomyopathy: Secondary | ICD-10-CM | POA: Diagnosis present

## 2021-01-20 DIAGNOSIS — N132 Hydronephrosis with renal and ureteral calculous obstruction: Secondary | ICD-10-CM | POA: Diagnosis not present

## 2021-01-20 DIAGNOSIS — Z66 Do not resuscitate: Secondary | ICD-10-CM | POA: Diagnosis present

## 2021-01-20 DIAGNOSIS — F0390 Unspecified dementia without behavioral disturbance: Secondary | ICD-10-CM | POA: Diagnosis present

## 2021-01-20 DIAGNOSIS — J9601 Acute respiratory failure with hypoxia: Secondary | ICD-10-CM | POA: Diagnosis present

## 2021-01-20 DIAGNOSIS — D270 Benign neoplasm of right ovary: Secondary | ICD-10-CM | POA: Diagnosis present

## 2021-01-20 DIAGNOSIS — B9620 Unspecified Escherichia coli [E. coli] as the cause of diseases classified elsewhere: Secondary | ICD-10-CM | POA: Diagnosis not present

## 2021-01-20 DIAGNOSIS — E876 Hypokalemia: Secondary | ICD-10-CM | POA: Diagnosis present

## 2021-01-20 DIAGNOSIS — J9602 Acute respiratory failure with hypercapnia: Secondary | ICD-10-CM | POA: Diagnosis present

## 2021-01-20 DIAGNOSIS — A4151 Sepsis due to Escherichia coli [E. coli]: Secondary | ICD-10-CM | POA: Diagnosis present

## 2021-01-20 DIAGNOSIS — E8729 Other acidosis: Secondary | ICD-10-CM

## 2021-01-20 DIAGNOSIS — N179 Acute kidney failure, unspecified: Secondary | ICD-10-CM | POA: Diagnosis present

## 2021-01-20 DIAGNOSIS — R6521 Severe sepsis with septic shock: Secondary | ICD-10-CM | POA: Diagnosis present

## 2021-01-20 DIAGNOSIS — F419 Anxiety disorder, unspecified: Secondary | ICD-10-CM | POA: Diagnosis present

## 2021-01-20 DIAGNOSIS — D6959 Other secondary thrombocytopenia: Secondary | ICD-10-CM | POA: Diagnosis present

## 2021-01-20 DIAGNOSIS — R0602 Shortness of breath: Secondary | ICD-10-CM

## 2021-01-20 DIAGNOSIS — J95821 Acute postprocedural respiratory failure: Secondary | ICD-10-CM | POA: Diagnosis not present

## 2021-01-20 DIAGNOSIS — F430 Acute stress reaction: Secondary | ICD-10-CM | POA: Diagnosis present

## 2021-01-20 DIAGNOSIS — R109 Unspecified abdominal pain: Secondary | ICD-10-CM

## 2021-01-20 DIAGNOSIS — N201 Calculus of ureter: Secondary | ICD-10-CM

## 2021-01-20 DIAGNOSIS — Z789 Other specified health status: Secondary | ICD-10-CM

## 2021-01-20 DIAGNOSIS — R7881 Bacteremia: Secondary | ICD-10-CM | POA: Diagnosis not present

## 2021-01-20 DIAGNOSIS — Z0189 Encounter for other specified special examinations: Secondary | ICD-10-CM

## 2021-01-20 DIAGNOSIS — Z20822 Contact with and (suspected) exposure to covid-19: Secondary | ICD-10-CM | POA: Diagnosis present

## 2021-01-20 DIAGNOSIS — Z452 Encounter for adjustment and management of vascular access device: Secondary | ICD-10-CM

## 2021-01-20 DIAGNOSIS — Z978 Presence of other specified devices: Secondary | ICD-10-CM

## 2021-01-20 DIAGNOSIS — N838 Other noninflammatory disorders of ovary, fallopian tube and broad ligament: Secondary | ICD-10-CM

## 2021-01-20 HISTORY — PX: CYSTOSCOPY WITH STENT PLACEMENT: SHX5790

## 2021-01-20 LAB — CALCIUM: Calcium: 6.5 mg/dL — ABNORMAL LOW (ref 8.9–10.3)

## 2021-01-20 LAB — URINALYSIS, COMPLETE (UACMP) WITH MICROSCOPIC
Bilirubin Urine: NEGATIVE
Glucose, UA: NEGATIVE mg/dL
Ketones, ur: NEGATIVE mg/dL
Nitrite: NEGATIVE
Protein, ur: 100 mg/dL — AB
RBC / HPF: >50 RBC/hpf — ABNORMAL HIGH (ref 0–5)
Specific Gravity, Urine: 1.013 (ref 1.005–1.030)
Squamous Epithelial / LPF: NONE SEEN (ref 0–5)
WBC, UA: >50 WBC/hpf — ABNORMAL HIGH (ref 0–5)
pH: 5 (ref 5.0–8.0)

## 2021-01-20 LAB — CBC WITH DIFFERENTIAL/PLATELET
Abs Immature Granulocytes: 0.1 10*3/uL — ABNORMAL HIGH (ref 0.00–0.07)
Basophils Absolute: 0 10*3/uL (ref 0.0–0.1)
Basophils Relative: 0 %
Eosinophils Absolute: 0 10*3/uL (ref 0.0–0.5)
Eosinophils Relative: 0 %
HCT: 39.9 % (ref 36.0–46.0)
Hemoglobin: 13.1 g/dL (ref 12.0–15.0)
Immature Granulocytes: 1 %
Lymphocytes Relative: 5 %
Lymphs Abs: 0.5 10*3/uL — ABNORMAL LOW (ref 0.7–4.0)
MCH: 30.9 pg (ref 26.0–34.0)
MCHC: 32.8 g/dL (ref 30.0–36.0)
MCV: 94.1 fL (ref 80.0–100.0)
Monocytes Absolute: 0.1 10*3/uL (ref 0.1–1.0)
Monocytes Relative: 1 %
Neutro Abs: 9.3 10*3/uL — ABNORMAL HIGH (ref 1.7–7.7)
Neutrophils Relative %: 93 %
Platelets: 165 10*3/uL (ref 150–400)
RBC: 4.24 MIL/uL (ref 3.87–5.11)
RDW: 13 % (ref 11.5–15.5)
Smear Review: NORMAL
WBC: 10 10*3/uL (ref 4.0–10.5)
nRBC: 0 % (ref 0.0–0.2)

## 2021-01-20 LAB — LACTIC ACID, PLASMA
Lactic Acid, Venous: 10.9 mmol/L (ref 0.5–1.9)
Lactic Acid, Venous: 6.5 mmol/L (ref 0.5–1.9)
Lactic Acid, Venous: 5.6 mmol/L (ref 0.5–1.9)
Lactic Acid, Venous: 5.8 mmol/L (ref 0.5–1.9)
Lactic Acid, Venous: 6.5 mmol/L (ref 0.5–1.9)

## 2021-01-20 LAB — PHOSPHORUS: Phosphorus: 3.4 mg/dL (ref 2.5–4.6)

## 2021-01-20 LAB — RESP PANEL BY RT-PCR (FLU A&B, COVID) ARPGX2
Influenza A by PCR: NEGATIVE
Influenza B by PCR: NEGATIVE
SARS Coronavirus 2 by RT PCR: NEGATIVE

## 2021-01-20 LAB — COMPREHENSIVE METABOLIC PANEL
ALT: 18 U/L (ref 0–44)
AST: 45 U/L — ABNORMAL HIGH (ref 15–41)
Albumin: 3.1 g/dL — ABNORMAL LOW (ref 3.5–5.0)
Alkaline Phosphatase: 60 U/L (ref 38–126)
Anion gap: 18 — ABNORMAL HIGH (ref 5–15)
BUN: 38 mg/dL — ABNORMAL HIGH (ref 8–23)
CO2: 19 mmol/L — ABNORMAL LOW (ref 22–32)
Calcium: 7.7 mg/dL — ABNORMAL LOW (ref 8.9–10.3)
Chloride: 103 mmol/L (ref 98–111)
Creatinine, Ser: 1.44 mg/dL — ABNORMAL HIGH (ref 0.44–1.00)
GFR, Estimated: 34 mL/min — ABNORMAL LOW (ref >60)
Glucose, Bld: 158 mg/dL — ABNORMAL HIGH (ref 70–99)
Potassium: 3.6 mmol/L (ref 3.5–5.1)
Sodium: 140 mmol/L (ref 135–145)
Total Bilirubin: 1.4 mg/dL — ABNORMAL HIGH (ref 0.3–1.2)
Total Protein: 5.3 g/dL — ABNORMAL LOW (ref 6.5–8.1)

## 2021-01-20 LAB — TROPONIN I (HIGH SENSITIVITY)
Troponin I (High Sensitivity): 387 ng/L (ref <18)
Troponin I (High Sensitivity): 293 ng/L (ref <18)

## 2021-01-20 LAB — MRSA NEXT GEN BY PCR, NASAL: MRSA by PCR Next Gen: NOT DETECTED

## 2021-01-20 LAB — POTASSIUM: Potassium: 3.1 mmol/L — ABNORMAL LOW (ref 3.5–5.1)

## 2021-01-20 LAB — PROTIME-INR
INR: 1.3 — ABNORMAL HIGH (ref 0.8–1.2)
Prothrombin Time: 16.6 seconds — ABNORMAL HIGH (ref 11.4–15.2)

## 2021-01-20 LAB — MAGNESIUM: Magnesium: 1.4 mg/dL — ABNORMAL LOW (ref 1.7–2.4)

## 2021-01-20 LAB — PROCALCITONIN: Procalcitonin: 58.25 ng/mL

## 2021-01-20 LAB — GLUCOSE, CAPILLARY
Glucose-Capillary: 190 mg/dL — ABNORMAL HIGH (ref 70–99)
Glucose-Capillary: 188 mg/dL — ABNORMAL HIGH (ref 70–99)

## 2021-01-20 SURGERY — CYSTOSCOPY, WITH STENT INSERTION
Anesthesia: General | Laterality: Left

## 2021-01-20 MED ORDER — ACETAMINOPHEN 325 MG PO TABS
650.0000 mg | ORAL_TABLET | ORAL | Status: DC | PRN
  Administered 2021-01-22: 650 mg via ORAL
  Filled 2021-01-20: qty 2

## 2021-01-20 MED ORDER — ONDANSETRON HCL 4 MG/2ML IJ SOLN
4.0000 mg | Freq: Four times a day (QID) | INTRAMUSCULAR | Status: DC | PRN
  Administered 2021-01-20 – 2021-01-22 (×4): 4 mg via INTRAVENOUS
  Filled 2021-01-20 (×3): qty 2

## 2021-01-20 MED ORDER — ACETAMINOPHEN 500 MG PO TABS
1000.0000 mg | ORAL_TABLET | Freq: Once | ORAL | Status: AC
  Administered 2021-01-20: 1000 mg via ORAL
  Filled 2021-01-20: qty 2

## 2021-01-20 MED ORDER — NOREPINEPHRINE 16 MG/250ML-% IV SOLN
0.0000 ug/min | INTRAVENOUS | Status: DC
  Administered 2021-01-20: 15 ug/min via INTRAVENOUS
  Filled 2021-01-20: qty 250

## 2021-01-20 MED ORDER — DEXMEDETOMIDINE HCL IN NACL 400 MCG/100ML IV SOLN
0.4000 ug/kg/h | INTRAVENOUS | Status: DC
  Administered 2021-01-20: 0.6 ug/kg/h via INTRAVENOUS
  Administered 2021-01-21: 0.4 ug/kg/h via INTRAVENOUS
  Filled 2021-01-20 (×2): qty 100

## 2021-01-20 MED ORDER — ALBUMIN HUMAN 5 % IV SOLN
INTRAVENOUS | Status: DC | PRN
Start: 2021-01-20 — End: 2021-01-20

## 2021-01-20 MED ORDER — SODIUM CHLORIDE 0.9 % IV BOLUS
1000.0000 mL | Freq: Once | INTRAVENOUS | Status: AC
  Administered 2021-01-20: 1000 mL via INTRAVENOUS

## 2021-01-20 MED ORDER — SODIUM CHLORIDE 0.9 % IV SOLN
250.0000 mL | INTRAVENOUS | Status: DC
  Administered 2021-01-21 – 2021-01-22 (×2): 250 mL via INTRAVENOUS

## 2021-01-20 MED ORDER — PANTOPRAZOLE SODIUM 40 MG IV SOLR
40.0000 mg | Freq: Every day | INTRAVENOUS | Status: DC
  Administered 2021-01-20 – 2021-01-22 (×3): 40 mg via INTRAVENOUS
  Filled 2021-01-20 (×3): qty 40

## 2021-01-20 MED ORDER — VANCOMYCIN VARIABLE DOSE PER UNSTABLE RENAL FUNCTION (PHARMACIST DOSING): Status: DC

## 2021-01-20 MED ORDER — PROPOFOL 1000 MG/100ML IV EMUL: 5.0000 ug/kg/min | INTRAVENOUS | Status: DC

## 2021-01-20 MED ORDER — CHLORHEXIDINE GLUCONATE 0.12% ORAL RINSE (MEDLINE KIT)
15.0000 mL | Freq: Two times a day (BID) | OROMUCOSAL | Status: DC
  Administered 2021-01-20 – 2021-01-27 (×9): 15 mL via OROMUCOSAL

## 2021-01-20 MED ORDER — LIDOCAINE HCL (PF) 2 % IJ SOLN
INTRAMUSCULAR | Status: AC
  Filled 2021-01-20: qty 5

## 2021-01-20 MED ORDER — FENTANYL CITRATE (PF) 100 MCG/2ML IJ SOLN: 25.0000 ug | INTRAMUSCULAR | Status: DC | PRN

## 2021-01-20 MED ORDER — CALCIUM GLUCONATE-NACL 2-0.675 GM/100ML-% IV SOLN
2.0000 g | Freq: Once | INTRAVENOUS | Status: AC
  Administered 2021-01-20: 2000 mg via INTRAVENOUS
  Filled 2021-01-20: qty 100

## 2021-01-20 MED ORDER — FENTANYL CITRATE (PF) 100 MCG/2ML IJ SOLN
INTRAMUSCULAR | Status: DC | PRN
  Administered 2021-01-20: 50 ug via INTRAVENOUS
  Administered 2021-01-20: 25 ug via INTRAVENOUS

## 2021-01-20 MED ORDER — FENTANYL 2500MCG IN NS 250ML (10MCG/ML) PREMIX INFUSION
25.0000 ug/h | INTRAVENOUS | Status: DC
  Administered 2021-01-20: 25 ug/h via INTRAVENOUS

## 2021-01-20 MED ORDER — SUCCINYLCHOLINE CHLORIDE 200 MG/10ML IV SOSY
PREFILLED_SYRINGE | INTRAVENOUS | Status: DC | PRN
  Administered 2021-01-20: 60 mg via INTRAVENOUS

## 2021-01-20 MED ORDER — FENTANYL CITRATE (PF) 100 MCG/2ML IJ SOLN
INTRAMUSCULAR | Status: AC
  Filled 2021-01-20: qty 2

## 2021-01-20 MED ORDER — MIDAZOLAM HCL 2 MG/2ML IJ SOLN: 1.0000 mg | INTRAMUSCULAR | Status: DC | PRN

## 2021-01-20 MED ORDER — SODIUM CHLORIDE 0.9 % IV BOLUS (SEPSIS)
500.0000 mL | Freq: Once | INTRAVENOUS | Status: AC
  Administered 2021-01-20: 500 mL via INTRAVENOUS

## 2021-01-20 MED ORDER — CHLORHEXIDINE GLUCONATE CLOTH 2 % EX PADS
6.0000 | MEDICATED_PAD | Freq: Every day | CUTANEOUS | Status: DC
  Administered 2021-01-22 – 2021-01-23 (×2): 6 via TOPICAL

## 2021-01-20 MED ORDER — LACTATED RINGERS IV SOLN: INTRAVENOUS | Status: AC

## 2021-01-20 MED ORDER — IOHEXOL 180 MG/ML  SOLN
INTRAMUSCULAR | Status: DC | PRN
  Administered 2021-01-20: 15 mL

## 2021-01-20 MED ORDER — DOCUSATE SODIUM 50 MG/5ML PO LIQD
100.0000 mg | Freq: Two times a day (BID) | ORAL | Status: DC
  Administered 2021-01-20 – 2021-01-21 (×3): 100 mg
  Filled 2021-01-20 (×3): qty 10

## 2021-01-20 MED ORDER — SODIUM CHLORIDE 0.9 % IR SOLN
Status: DC | PRN
  Administered 2021-01-20: 3000 mL via INTRAVESICAL

## 2021-01-20 MED ORDER — NOREPINEPHRINE 4 MG/250ML-% IV SOLN: 2.0000 ug/min | INTRAVENOUS | Status: DC

## 2021-01-20 MED ORDER — SODIUM CHLORIDE 0.9 % IV BOLUS (SEPSIS)
1000.0000 mL | Freq: Once | INTRAVENOUS | Status: AC
  Administered 2021-01-20: 1000 mL via INTRAVENOUS

## 2021-01-20 MED ORDER — ORAL CARE MOUTH RINSE
15.0000 mL | OROMUCOSAL | Status: DC
  Administered 2021-01-20 – 2021-01-21 (×9): 15 mL via OROMUCOSAL

## 2021-01-20 MED ORDER — DOCUSATE SODIUM 100 MG PO CAPS: 100.0000 mg | ORAL_CAPSULE | Freq: Two times a day (BID) | ORAL | Status: DC | PRN

## 2021-01-20 MED ORDER — STERILE WATER FOR INJECTION IV SOLN
INTRAVENOUS | Status: DC
  Filled 2021-01-20 (×4): qty 1000
  Filled 2021-01-20: qty 150
  Filled 2021-01-20: qty 1000
  Filled 2021-01-20 (×2): qty 150
  Filled 2021-01-20: qty 1000

## 2021-01-20 MED ORDER — ONDANSETRON HCL 4 MG/2ML IJ SOLN
INTRAMUSCULAR | Status: AC
  Filled 2021-01-20: qty 2

## 2021-01-20 MED ORDER — FENTANYL CITRATE (PF) 100 MCG/2ML IJ SOLN
25.0000 ug | Freq: Once | INTRAMUSCULAR | Status: DC
Start: 2021-01-20 — End: 2021-01-22

## 2021-01-20 MED ORDER — SODIUM CHLORIDE 0.9 % IV SOLN
2.0000 g | Freq: Once | INTRAVENOUS | Status: AC
  Administered 2021-01-20: 2 g via INTRAVENOUS
  Filled 2021-01-20: qty 2

## 2021-01-20 MED ORDER — POLYETHYLENE GLYCOL 3350 17 G PO PACK
17.0000 g | PACK | Freq: Every day | ORAL | Status: DC
  Administered 2021-01-20 – 2021-01-21 (×2): 17 g
  Filled 2021-01-20 (×2): qty 1

## 2021-01-20 MED ORDER — METRONIDAZOLE 500 MG/100ML IV SOLN
500.0000 mg | Freq: Once | INTRAVENOUS | Status: AC
  Administered 2021-01-20: 500 mg via INTRAVENOUS
  Filled 2021-01-20: qty 100

## 2021-01-20 MED ORDER — NEOSTIGMINE METHYLSULFATE 10 MG/10ML IV SOLN: INTRAVENOUS | Status: DC | PRN

## 2021-01-20 MED ORDER — PROPOFOL 10 MG/ML IV BOLUS
INTRAVENOUS | Status: DC | PRN
Start: 2021-01-20 — End: 2021-01-20
  Administered 2021-01-20 (×2): 10 mg via INTRAVENOUS
  Administered 2021-01-20: 40 mg via INTRAVENOUS
  Administered 2021-01-20: 50 mg via INTRAVENOUS

## 2021-01-20 MED ORDER — SODIUM CHLORIDE 0.9 % IV SOLN
INTRAVENOUS | Status: DC | PRN
  Administered 2021-01-20: 20 ug/min via INTRAVENOUS

## 2021-01-20 MED ORDER — EPINEPHRINE 1 MG/10ML IJ SOSY
PREFILLED_SYRINGE | INTRAMUSCULAR | Status: DC | PRN
  Administered 2021-01-20 (×3): 20 ug via INTRAVENOUS
  Administered 2021-01-20: 10 ug via INTRAVENOUS

## 2021-01-20 MED ORDER — METRONIDAZOLE 500 MG/100ML IV SOLN
500.0000 mg | Freq: Two times a day (BID) | INTRAVENOUS | Status: DC
  Administered 2021-01-21 – 2021-01-22 (×3): 500 mg via INTRAVENOUS
  Filled 2021-01-20 (×6): qty 100

## 2021-01-20 MED ORDER — NOREPINEPHRINE 4 MG/250ML-% IV SOLN
INTRAVENOUS | Status: AC
  Administered 2021-01-20: 18 ug/min via INTRAVENOUS
  Filled 2021-01-20: qty 250

## 2021-01-20 MED ORDER — VANCOMYCIN HCL IN DEXTROSE 1-5 GM/200ML-% IV SOLN
1000.0000 mg | Freq: Once | INTRAVENOUS | Status: AC
  Administered 2021-01-20: 1000 mg via INTRAVENOUS
  Filled 2021-01-20: qty 200

## 2021-01-20 MED ORDER — VASOPRESSIN 20 UNITS/100 ML INFUSION FOR SHOCK
0.0000 [IU]/min | INTRAVENOUS | Status: DC
  Administered 2021-01-20 – 2021-01-21 (×2): 0.03 [IU]/min via INTRAVENOUS
  Filled 2021-01-20 (×2): qty 100

## 2021-01-20 MED ORDER — MAGNESIUM SULFATE 4 GM/100ML IV SOLN
4.0000 g | Freq: Once | INTRAVENOUS | Status: AC
  Administered 2021-01-20: 4 g via INTRAVENOUS
  Filled 2021-01-20: qty 100

## 2021-01-20 MED ORDER — MIDAZOLAM HCL 2 MG/2ML IJ SOLN
1.0000 mg | INTRAMUSCULAR | Status: DC | PRN
Start: 2021-01-20 — End: 2021-01-22
  Administered 2021-01-20 – 2021-01-21 (×2): 1 mg via INTRAVENOUS
  Filled 2021-01-20 (×2): qty 2

## 2021-01-20 MED ORDER — POLYETHYLENE GLYCOL 3350 17 G PO PACK: 17.0000 g | PACK | Freq: Every day | ORAL | Status: DC | PRN

## 2021-01-20 MED ORDER — PROPOFOL 1000 MG/100ML IV EMUL
INTRAVENOUS | Status: AC
  Filled 2021-01-20: qty 100

## 2021-01-20 MED ORDER — POTASSIUM CHLORIDE 20 MEQ PO PACK
40.0000 meq | PACK | Freq: Once | ORAL | Status: AC
  Administered 2021-01-20: 40 meq
  Filled 2021-01-20: qty 2

## 2021-01-20 MED ORDER — LIDOCAINE HCL (CARDIAC) PF 100 MG/5ML IV SOSY
PREFILLED_SYRINGE | INTRAVENOUS | Status: DC | PRN
  Administered 2021-01-20: 50 mg via INTRAVENOUS

## 2021-01-20 MED ORDER — ALBUMIN HUMAN 5 % IV SOLN
INTRAVENOUS | Status: AC
  Filled 2021-01-20: qty 250

## 2021-01-20 MED ORDER — NOREPINEPHRINE 16 MG/250ML-% IV SOLN
0.0000 ug/min | INTRAVENOUS | Status: DC
  Administered 2021-01-21: 8 ug/min via INTRAVENOUS
  Filled 2021-01-20: qty 250

## 2021-01-20 MED ORDER — IPRATROPIUM-ALBUTEROL 0.5-2.5 (3) MG/3ML IN SOLN: 3.0000 mL | RESPIRATORY_TRACT | Status: DC | PRN

## 2021-01-20 MED ORDER — LACTATED RINGERS IV BOLUS
500.0000 mL | Freq: Once | INTRAVENOUS | Status: AC
  Administered 2021-01-20: 500 mL via INTRAVENOUS

## 2021-01-20 MED ORDER — ALPRAZOLAM 0.5 MG PO TABS: 0.5000 mg | ORAL_TABLET | Freq: Every evening | ORAL | Status: DC | PRN

## 2021-01-20 MED ORDER — SODIUM CHLORIDE 0.9 % IV SOLN
2.0000 g | INTRAVENOUS | Status: DC
  Filled 2021-01-20: qty 2

## 2021-01-20 MED ORDER — FENTANYL BOLUS VIA INFUSION
25.0000 ug | INTRAVENOUS | Status: DC | PRN
  Filled 2021-01-20: qty 100

## 2021-01-20 MED ORDER — 0.9 % SODIUM CHLORIDE (POUR BTL) OPTIME
TOPICAL | Status: DC | PRN
  Administered 2021-01-20: 500 mL

## 2021-01-20 MED ORDER — PROPOFOL 10 MG/ML IV BOLUS
INTRAVENOUS | Status: AC
  Filled 2021-01-20: qty 20

## 2021-01-20 MED ORDER — HYDROCORTISONE NA SUCCINATE PF 100 MG IJ SOLR
100.0000 mg | Freq: Two times a day (BID) | INTRAMUSCULAR | Status: AC
Start: 2021-01-20 — End: 2021-01-21
  Administered 2021-01-20 – 2021-01-21 (×2): 100 mg via INTRAVENOUS
  Filled 2021-01-20 (×2): qty 2

## 2021-01-20 MED ORDER — LACTATED RINGERS IV SOLN: INTRAVENOUS | Status: DC

## 2021-01-20 MED ORDER — EPINEPHRINE PF 1 MG/ML IJ SOLN
INTRAMUSCULAR | Status: AC
  Filled 2021-01-20: qty 1

## 2021-01-20 MED ORDER — PHENYLEPHRINE HCL (PRESSORS) 10 MG/ML IV SOLN
INTRAVENOUS | Status: DC | PRN
  Administered 2021-01-20 (×2): 200 ug via INTRAVENOUS

## 2021-01-20 MED ORDER — FENTANYL 2500MCG IN NS 250ML (10MCG/ML) PREMIX INFUSION
INTRAVENOUS | Status: AC
  Filled 2021-01-20: qty 250

## 2021-01-20 MED ORDER — NOREPINEPHRINE 4 MG/250ML-% IV SOLN: 0.0000 ug/min | INTRAVENOUS | Status: DC

## 2021-01-20 SURGICAL SUPPLY — 23 items
BAG DRAIN CYSTO-URO LG1000N (MISCELLANEOUS) ×2 IMPLANT
BRUSH SCRUB EZ 1% IODOPHOR (MISCELLANEOUS) IMPLANT
CATH URETL OPEN 5X70 (CATHETERS) ×2 IMPLANT
GAUZE 4X4 16PLY ~~LOC~~+RFID DBL (SPONGE) ×4 IMPLANT
GLOVE SURG UNDER POLY LF SZ7.5 (GLOVE) ×2 IMPLANT
GOWN STRL REUS W/ TWL LRG LVL3 (GOWN DISPOSABLE) ×1 IMPLANT
GOWN STRL REUS W/ TWL XL LVL3 (GOWN DISPOSABLE) ×1 IMPLANT
GOWN STRL REUS W/TWL LRG LVL3 (GOWN DISPOSABLE) ×1
GOWN STRL REUS W/TWL XL LVL3 (GOWN DISPOSABLE) ×1
GUIDEWIRE STR DUAL SENSOR (WIRE) ×2 IMPLANT
IV NS IRRIG 3000ML ARTHROMATIC (IV SOLUTION) ×2 IMPLANT
KIT TURNOVER CYSTO (KITS) ×2 IMPLANT
MANIFOLD NEPTUNE II (INSTRUMENTS) ×2 IMPLANT
PACK CYSTO AR (MISCELLANEOUS) ×2 IMPLANT
SET CYSTO W/LG BORE CLAMP LF (SET/KITS/TRAYS/PACK) ×2 IMPLANT
STENT URET 6FRX24 CONTOUR (STENTS) IMPLANT
STENT URET 6FRX26 CONTOUR (STENTS) IMPLANT
STENT URO INLAY 6FRX24CM (STENTS) ×2 IMPLANT
SURGILUBE 2OZ TUBE FLIPTOP (MISCELLANEOUS) ×2 IMPLANT
SYR TOOMEY IRRIG 70ML (MISCELLANEOUS)
SYRINGE TOOMEY IRRIG 70ML (MISCELLANEOUS) IMPLANT
WATER STERILE IRR 1000ML POUR (IV SOLUTION) ×2 IMPLANT
WATER STERILE IRR 500ML POUR (IV SOLUTION) ×2 IMPLANT

## 2021-01-20 NOTE — Consult Note (Signed)
Urology Consult   I have been asked to see the patient by Dr. Jari Pigg, for evaluation and management of sepsis from urinary source and left UPJ stone with hydronephrosis.  Chief Complaint: Sepsis, left ureteral stone  HPI:  Lisa Gamble is a 87 y.o. year old who was found incoherent at home this morning by her son with a fever of 103.  Work-up in the ED showed urinalysis consistent with UTI, and CT showed an 8 mm left proximal ureteral stone with upstream hydronephrosis and multiple renal stones, in addition to a cystic 7 cm lesion of the right ovary that had increased from prior, as well as profound periportal edema and intrahepatic biliary ductal dilation, and a large stool ball in the rectum.  The patient is somnolent and confused, and the history is obtained entirely from the son.  Lab work notable for elevated lactate of 10.9, elevated creatinine of 1.44 from a baseline of 0.8, and urinalysis grossly infected.  She was given cefepime and vancomycin in the ED.  PMH: Past Medical History:  Diagnosis Date   Anxiety    Arthritis    Asthma    HOH (hard of hearing)    Wears dentures    full upper    Surgical History: Past Surgical History:  Procedure Laterality Date   APPENDECTOMY     COLONOSCOPY     ORIF HIP FRACTURE Left    PTOSIS REPAIR Bilateral 12/31/2017   Procedure: PTOSIS REPAIR RESECT EX;  Surgeon: Karle Starch, MD;  Location: [facility];  Service: Ophthalmology;  Laterality: Bilateral;   TOTAL HIP ARTHROPLASTY Left      Allergies:  Allergies  Allergen Reactions   Amitriptyline     Other reaction(s): Unknown Onset 01/06/1999.   Codeine     Doesn't remember reaction   Lithium     Other reaction(s): Unknown Onset 07/24/2009   Tetracycline     Other reaction(s): Unknown Onset 01/06/1999.   Tramadol Diarrhea   Trazodone     Other reaction(s): Unknown Onset 07/25/2009.   Venlafaxine     Other reaction(s): Unknown Onset 01/06/1999.    Family  History: History reviewed. No pertinent family history.  Social History:  reports that she has never smoked. She has never used smokeless tobacco. She reports that she does not currently use alcohol. No history on file for drug use.  ROS: Negative aside from those stated in the HPI.  Physical Exam: BP (!) 88/57   Pulse 98   Temp (!) 97 F (36.1 C) (Oral)   Resp 18   Ht '5\' 3"'$  (1.6 m)   Wt 49 kg   SpO2 97%   BMI 19.14 kg/m    Constitutional: Confused, nonconversational Cardiovascular: Tachycardic, regular rhythm Respiratory: Diminished bilaterally GI: Abdomen is soft, nontender, nondistended, no abdominal masses Lymph: No cervical or inguinal lymphadenopathy. Skin: No rashes, bruises or suspicious lesions.   Laboratory Data: Reviewed, see HPI  Pertinent Imaging: I have personally reviewed the CT showing an 8 mm left proximal ureteral stone with upstream hydronephrosis and multiple large renal stones.  Assessment & Plan:   87 year old female with severe sepsis from urinary source with left 8 mm UPJ stone and upstream hydronephrosis, and multiple renal stones.  Additional pathology on CT includes a right ovarian cyst worrisome for malignancy, as well as significant periportal edema and intrahepatic biliary dilation.  We discussed the need for drainage in the setting of an infected and obstructed system.  A ureteral stent  is a small plastic tube that is placed cystoscopically with one end in the kidney and the other end in the bladder that allows the infection from the kidney to drain, and relieves pain from the obstructing stone.  We discussed the risks at length including bleeding, infection, sepsis, death, ureteral injury, and stent related symptoms including urgency/frequency/dysuria/flank pain/gross hematuria.  There is a low, but not 0, risk of inability to pass the ureteral stent alongside the stone from below which would require percutaneous nephrostomy tube by interventional  radiology.  Finally, we discussed possible prolonged hospitalization and recovery, possible temporary Foley catheter placement, and 10 to 14-day course of antibiotics.  We reviewed the need for a follow-up procedure for definitive management of their stone when the infection has been treated in 2 to 3 weeks with either ureteroscopy/laser lithotripsy, versus potential just observation with stent in place if her overall health remains very poor.  We also discussed alternative including observation with no intervention and likely death within the next few hours or days secondary to sepsis from urinary source, or nephrostomy tube with significant impact on quality of life.  The son opts for attempted cystoscopy and left ureteral stent placement with antibiotics, and understands with her age and comorbidities it will be a prolonged recovery if she pulls through the surgery.  Recommendations: OR today for cystoscopy and left ureteral stent placement Agree with ICU admission for resuscitation and broad-spectrum antibiotics  Billey Co, MD   [facility] [address], [place], [place] [address] [phone]

## 2021-01-20 NOTE — Consult Note (Signed)
PHARMACY CONSULT NOTE - FOLLOW UP  Pharmacy Consult for Electrolyte Monitoring and Replacement   Recent Labs: Potassium (mmol/L)  Date Value  01/20/2021 3.6  02/07/2013 3.7   Calcium (mg/dL)  Date Value  01/20/2021 7.7 (L)   Calcium, Total (mg/dL)  Date Value  02/07/2013 8.5   Albumin (g/dL)  Date Value  01/20/2021 3.1 (L)   Sodium (mmol/L)  Date Value  01/20/2021 140  02/07/2013 141   Corrected Ca: 8.4  Assessment:  87 y.o. year old presented with sepsis, left ureteral stone, AKI requiring sent placement/drainage. Admitted to ICU for vasopressor support. Pharmacy has been consulted for monitoring and replacement of electrolytes as needed.  Goal of Therapy:  Electrolytes WNL  Plan:  No additional electrolyte replacement warranted at this time Mag and Phos with AM labs Repeat CMP with AM labs   Dorothe Pea ,PharmD, BCPS Clinical Pharmacist 01/20/2021 4:30 PM

## 2021-01-20 NOTE — Transfer of Care (Signed)
Immediate Anesthesia Transfer of Care Note  Patient: Lisa Gamble  Procedure(s) Performed: CYSTOSCOPY WITH STENT PLACEMENT (Left)  Patient Location: ICU  Anesthesia Type:General  Level of Consciousness: Patient remains intubated per anesthesia plan  Airway & Oxygen Therapy: Patient remains intubated per anesthesia plan and Patient placed on Ventilator (see vital sign flow sheet for setting)  Post-op Assessment: Report given to RN and Post -op Vital signs reviewed and unstable, Anesthesiologist notified  Post vital signs: Reviewed and unstable  Last Vitals:  Vitals Value Taken Time  BP 65/50 01/20/21 1701  Temp 36.8 C 01/20/21 1653  Pulse 94 01/20/21 1705  Resp 23 01/20/21 1705  SpO2 98 % 01/20/21 1705  Vitals shown include unvalidated device data.  Last Pain:  Vitals:   01/20/21 1653  TempSrc: Axillary  PainSc:          Complications: No notable events documented.

## 2021-01-20 NOTE — ED Provider Notes (Signed)
[facility] Emergency Department Provider Note  ____________________________________________   Event Date/Time   First MD Initiated Contact with Patient 01/20/21 1134     (approximate)  I have reviewed the triage vital signs and the nursing notes.   HISTORY  Chief Complaint Code Sepsis and Altered Mental Status    HPI Lisa Gamble is a 87 y.o. female with arthritis who comes in from home for altered mental status.  Patient did not answer her phone from a meal delivery service and family was notified.  When family got there they found patient to be confused and not answering questions appropriately.  Patient is alert and oriented x1.  Unable to get full HPI from patient due to altered mental status.          Past Medical History:  Diagnosis Date   Anxiety    Arthritis    Asthma    HOH (hard of hearing)    Wears dentures    full upper    There are no problems to display for this patient.   Past Surgical History:  Procedure Laterality Date   APPENDECTOMY     COLONOSCOPY     ORIF HIP FRACTURE Left    PTOSIS REPAIR Bilateral 12/31/2017   Procedure: PTOSIS REPAIR RESECT EX;  Surgeon: Karle Starch, MD;  Location: [place];  Service: Ophthalmology;  Laterality: Bilateral;   TOTAL HIP ARTHROPLASTY Left     Prior to Admission medications   Medication Sig Start Date End Date Taking? Authorizing Provider  albuterol (PROVENTIL HFA;VENTOLIN HFA) 108 (90 Base) MCG/ACT inhaler Inhale into the lungs every 6 (six) hours as needed for wheezing or shortness of breath.    [provider]  ALPRAZolam Duanne Moron) 0.5 MG tablet Take 0.5 mg by mouth at bedtime as needed for anxiety.    [provider]  B Complex Vitamins (VITAMIN B COMPLEX PO) Take by mouth daily.    [provider]  Carboxymethylcellulose Sodium (LUBRICANT EYE DROPS OP) Apply to eye daily.    [provider]  Cholecalciferol (VITAMIN D3) 2000  units TABS Take by mouth daily.    [provider]  erythromycin ophthalmic ointment Apply to sutures 4 times a day for 10-12 days.  Discontinue if allergy develops and call our office 12/31/17   Karle Starch, MD  FOLIC ACID PO Take by mouth daily.    [provider]  Melatonin 3 MG TABS Take by mouth at bedtime as needed.    [provider]  vitamin B-12 (CYANOCOBALAMIN) 1000 MCG tablet Take 1,500 mcg by mouth daily.    [provider]    Allergies Codeine and Tramadol  No family history on file.  Social History Social History   Tobacco Use   Smoking status: Never   Smokeless tobacco: Never  Vaping Use   Vaping Use: Never used  Substance Use Topics   Alcohol use: Not Currently      Review of Systems Unable to get review of systems due to patient's altered mental status. ____________________________________________   PHYSICAL EXAM:  VITAL SIGNS: ED Triage Vitals  Enc Vitals Group     BP 01/20/21 1140 95/67     Pulse Rate 01/20/21 1140 (!) 134     Resp 01/20/21 1140 (!) 25     Temp 01/20/21 1140 97.7 F (36.5 C)     Temp Source 01/20/21 1140 Oral     SpO2 01/20/21 1140 97 %  Weight 01/20/21 1148 108 lb 0.4 oz (49 kg)     Height 01/20/21 1148 '5\' 3"'$  (1.6 m)     Head Circumference --      Peak Flow --      Pain Score 01/20/21 1147 0     Pain Loc --      Pain Edu? --      Excl. in [place]? --     Constitutional: Alert and oriented x1 Eyes: Conjunctivae are normal. EOMI. Head: Atraumatic. Nose: No congestion/rhinnorhea. Mouth/Throat: Mucous membranes are moist.   Neck: No stridor. Trachea Midline. FROM Cardiovascular: Tachycardia, regular rhythm. Grossly normal heart sounds.  Good peripheral circulation. Respiratory: Normal respiratory effort.  No retractions. Lungs CTAB. Gastrointestinal: Soft and nontender. No distention. No abdominal bruits.  Musculoskeletal: No lower extremity tenderness nor edema.  No joint  effusions. Neurologic:  Normal speech and language. No gross focal neurologic deficits are appreciated.  Patient does appear confused Skin:  Skin is warm, dry and intact. No rash noted.   ____________________________________________   LABS (all labs ordered are listed, but only abnormal results are displayed)  Labs Reviewed  CULTURE, BLOOD (ROUTINE X 2)  CULTURE, BLOOD (ROUTINE X 2)  COMPREHENSIVE METABOLIC PANEL  LACTIC ACID, PLASMA  LACTIC ACID, PLASMA  CBC WITH DIFFERENTIAL/PLATELET  PROTIME-INR  URINALYSIS, COMPLETE (UACMP) WITH MICROSCOPIC   ____________________________________________   ED ECG REPORT I, Vanessa Shelby, the attending physician, personally viewed and interpreted this ECG.  Sinus tachycardia rate of 112, no ST elevation, no T wave versions, normal intervals ____________________________________________  RADIOLOGY Robert Bellow, personally viewed and evaluated these images (plain radiographs) as part of my medical decision making, as well as reviewing the written report by the radiologist.  ED MD interpretation: No pneumonia on chest x-ray  Official radiology report(s): CT HEAD WO CONTRAST (5MM)  Result Date: 01/20/2021 CLINICAL DATA:  Head trauma.  Code sepsis. EXAM: CT HEAD WITHOUT CONTRAST CT CERVICAL SPINE WITHOUT CONTRAST TECHNIQUE: Multidetector CT imaging of the head and cervical spine was performed following the standard protocol without intravenous contrast. Multiplanar CT image reconstructions of the cervical spine were also generated. COMPARISON:  None. FINDINGS: CT HEAD FINDINGS Brain: Generalized atrophy without hydrocephalus. Periventricular white matter hypodensity bilaterally. No acute infarct, hemorrhage, mass. Vascular: Negative for hyperdense vessel Skull: Negative Sinuses/Orbits: Paranasal sinuses clear. Bilateral cataract surgery. Other: None CT CERVICAL SPINE FINDINGS Alignment: Anterolisthesis C2-3 and C3-4. Retrolisthesis C4-5, C5-6,  C6-7. Mild anterolisthesis C7-T1. Cervical kyphosis at C4. Skull base and vertebrae: Negative for fracture Soft tissues and spinal canal: No acute abnormality. Atherosclerotic calcification the carotid artery bilaterally. Disc levels: Advanced spondylosis. Multilevel disc and facet degeneration and spurring. Mild spinal stenosis C4-5, C5-6, C6-7. Mild foraminal narrowing due to spurring. Upper chest: Mild apical scarring on the right. Other: None IMPRESSION: 1. Atrophy and chronic microvascular ischemic change. No acute intracranial abnormality 2. Advanced cervical spondylosis.  Negative for fracture. Electronically Signed   By: Franchot Gallo M.D.   On: 01/20/2021 13:17   CT Cervical Spine Wo Contrast  Result Date: 01/20/2021 CLINICAL DATA:  Head trauma.  Code sepsis. EXAM: CT HEAD WITHOUT CONTRAST CT CERVICAL SPINE WITHOUT CONTRAST TECHNIQUE: Multidetector CT imaging of the head and cervical spine was performed following the standard protocol without intravenous contrast. Multiplanar CT image reconstructions of the cervical spine were also generated. COMPARISON:  None. FINDINGS: CT HEAD FINDINGS Brain: Generalized atrophy without hydrocephalus. Periventricular white matter hypodensity bilaterally. No acute infarct, hemorrhage, mass. Vascular: Negative for hyperdense vessel  Skull: Negative Sinuses/Orbits: Paranasal sinuses clear. Bilateral cataract surgery. Other: None CT CERVICAL SPINE FINDINGS Alignment: Anterolisthesis C2-3 and C3-4. Retrolisthesis C4-5, C5-6, C6-7. Mild anterolisthesis C7-T1. Cervical kyphosis at C4. Skull base and vertebrae: Negative for fracture Soft tissues and spinal canal: No acute abnormality. Atherosclerotic calcification the carotid artery bilaterally. Disc levels: Advanced spondylosis. Multilevel disc and facet degeneration and spurring. Mild spinal stenosis C4-5, C5-6, C6-7. Mild foraminal narrowing due to spurring. Upper chest: Mild apical scarring on the right. Other: None  IMPRESSION: 1. Atrophy and chronic microvascular ischemic change. No acute intracranial abnormality 2. Advanced cervical spondylosis.  Negative for fracture. Electronically Signed   By: Franchot Gallo M.D.   On: 01/20/2021 13:17   CT Renal Stone Study  Result Date: 01/20/2021 CLINICAL DATA:  Flank pain, confusion, code sepsis EXAM: CT ABDOMEN AND PELVIS WITHOUT CONTRAST TECHNIQUE: Multidetector CT imaging of the abdomen and pelvis was performed following the standard protocol without IV contrast. COMPARISON:  03/31/2007 FINDINGS: Lower chest: No acute abnormality. Hepatobiliary: No solid liver abnormality is seen. Multiple small gallstones in the dependent gallbladder. No gallbladder wall thickening. There is profound periportal edema and or intrahepatic biliary ductal dilatation (series 2, image 24). No calculus or other obstructing etiology identified to the ampulla. Pancreas: Unremarkable. No pancreatic ductal dilatation or surrounding inflammatory changes. Spleen: Normal in size without significant abnormality. Adrenals/Urinary Tract: Adrenal glands are unremarkable. There is a 0.8 cm calculus present near the left ureteropelvic junction, with moderate associated left hydronephrosis and hydroureter (series 5, image 57). Left-sided perinephric fat stranding. There are multiple additional bilateral nonobstructive renal calculi. Bladder is unremarkable. Stomach/Bowel: Stomach is within normal limits. Appendix is not clearly visualized. No evidence of bowel wall thickening, distention, or inflammatory changes. Sigmoid diverticulosis. Large stool ball in the rectum. Vascular/Lymphatic: Aortic atherosclerosis. No enlarged abdominal or pelvic lymph nodes. Reproductive: Cystic lesion of the right ovary with a thick internal septation, measuring 7.2 x 5.4 cm, significantly increased in size compared to remote prior examination dated 2008 (series 2, image 58). Other: No abdominal wall hernia or abnormality. No  abdominopelvic ascites. Musculoskeletal: No acute or significant osseous findings. Status post left hip total arthroplasty. IMPRESSION: 1. There is a 0.8 cm calculus present near the left ureteropelvic junction, with moderate associated left hydronephrosis and hydroureter. Left-sided perinephric fat stranding. 2. There are multiple additional bilateral nonobstructive renal calculi. 3. There is profound periportal edema and or intrahepatic biliary ductal dilatation. No calculus or other obstructing etiology identified to the ampulla. Correlate with biochemical evidence of biliary obstruction and consider MRI/MRCP to further evaluate if indicated by concern for biliary obstruction. 4. Cholelithiasis. 5. Cystic lesion of the right ovary with a thick internal septation, measuring 7.2 x 5.4 cm, significantly increased in size compared to remote prior examination dated 2008. 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 Because this lesion is not adequately characterized, prompt, although nonemergent [place] is recommended for further evaluation. Note: This recommendation does not apply to premenarchal patients and to those with increased risk (genetic, family history, elevated tumor markers or other high-risk factors) of ovarian cancer. Reference: JACR 2020 Feb; 17(2):248-254 6. Large stool ball in the rectum.  Correlate for impaction. Aortic Atherosclerosis (ICD10-I70.0). Electronically Signed   By: Eddie Candle M.D.   On: 01/20/2021 13:18    ____________________________________________   PROCEDURES  Procedure(s) performed (including Critical Care):  .1-3 Lead EKG Interpretation  Date/Time: 01/20/2021 1:31 PM Performed by: Vanessa [place], MD Authorized by: Vanessa ,  MD     Interpretation: abnormal     ECG rate:  110s   ECG rate assessment: tachycardic     Rhythm: sinus rhythm     Ectopy: none     Conduction: normal   .Critical Care  Date/Time: 01/20/2021 1:31  PM Performed by: Vanessa [place], MD Authorized by: Vanessa [place], MD   Critical care provider statement:    Critical care time (minutes):  45   Critical care was necessary to treat or prevent imminent or life-threatening deterioration of the following conditions:  Sepsis   Critical care was time spent personally by me on the following activities:  Discussions with consultants, evaluation of patient's response to treatment, examination of patient, ordering and performing treatments and interventions, ordering and review of laboratory studies, ordering and review of radiographic studies, pulse oximetry, re-evaluation of patient's condition, obtaining history from patient or surrogate and review of old charts   ____________________________________________   INITIAL IMPRESSION / [place] / ED COURSE  Lisa Gamble was evaluated in Emergency Department on 01/20/2021 for the symptoms described in the history of present illness. She was evaluated in the context of the global COVID-19 pandemic, which necessitated consideration that the patient might be at risk for infection with the SARS-CoV-2 virus that causes COVID-19. Institutional protocols and algorithms that pertain to the evaluation of patients at risk for COVID-19 are in a state of rapid change based on information released by regulatory bodies including the CDC and federal and state organizations. These policies and algorithms were followed during the patient's care in the ED.    Patient comes in with tachycardia, hypotension and febrile.  Sepsis alert was called.  Given patient's age and hypotension broad-spectrum antibiotics were started and full fluid resuscitation was ordered.  X-ray ordered evaluate for pneumonia, COVID swab ordered, urine evaluate for UTI.  On straight cath I did look a little bloody's I also ordered a CT renal to make sure no evidence of infected kidney stone.  Given her confusion and unclear if she had any  falls will get CT head CT cervical.  CT imaging was negative of her head however her CT abdomen did show an infected kidney stone.  Also some incidental findings including concern for potential cancer which I have alerted son about.  There is also concern for some edema around her right upper quadrant but her T bili is only slightly elevated and I suspect that her fevers are more likely from the infected kidney stone and we will hold off on MRCP now.  We will discussed with urology for stent placement the son is willing to have this done.  Will discuss with the hospital team for admission as long as her lactate is downtrending and her blood pressures are stable  2:37 PM after full fluid resuscitation patient's maps are still 65 and still tachycardic.  She is on the borderline of needing pressors.  Her lactate is getting better from 10 down to 6.  Discussed the ICU team and will give her another 1 L of fluid and they will reassess her for pressors.  Discussed with urology Dr. Diamantina Providence he is going to come and do a stent.  Patient will be admitted to the ICU team           ____________________________________________   FINAL CLINICAL IMPRESSION(S) / ED DIAGNOSES   Final diagnoses:  Altered mental status, unspecified altered mental status type  Kidney stone  Sepsis, due to unspecified organism, unspecified  whether acute organ dysfunction present [facility])      MEDICATIONS GIVEN DURING THIS VISIT:  Medications  sodium chloride 0.9 % bolus 1,000 mL (has no administration in time range)  sodium chloride 0.9 % bolus 1,000 mL (0 mLs Intravenous Stopped 01/20/21 1320)    And  sodium chloride 0.9 % bolus 500 mL (0 mLs Intravenous Stopped 01/20/21 1221)  ceFEPIme (MAXIPIME) 2 g in sodium chloride 0.9 % 100 mL IVPB (0 g Intravenous Stopped 01/20/21 1224)  metroNIDAZOLE (FLAGYL) IVPB 500 mg (0 mg Intravenous Stopped 01/20/21 1321)  vancomycin (VANCOCIN) IVPB 1000 mg/200 mL premix (0 mg Intravenous  Stopped 01/20/21 1437)  acetaminophen (TYLENOL) tablet 1,000 mg (1,000 mg Oral Given 01/20/21 1321)     ED Discharge Orders     None        Note:  This document was prepared using Dragon voice recognition software and may include unintentional dictation errors.    Vanessa McNary, MD 01/20/21 [phone]

## 2021-01-20 NOTE — Anesthesia Procedure Notes (Signed)
Procedure Name: Intubation Date/Time: 01/20/2021 4:12 PM Performed by: Debe Coder, CRNA Pre-anesthesia Checklist: Patient identified, Emergency Drugs available, Suction available and Patient being monitored Patient Re-evaluated:Patient Re-evaluated prior to induction Oxygen Delivery Method: Circle system utilized Preoxygenation: Pre-oxygenation with 100% oxygen Induction Type: IV induction Ventilation: Mask ventilation without difficulty Laryngoscope Size: Mac and 3 Grade View: Grade II Tube type: Oral Tube size: 7.0 mm Number of attempts: 1 Airway Equipment and Method: Stylet and Oral airway Placement Confirmation: ETT inserted through vocal cords under direct vision, positive ETCO2 and breath sounds checked- equal and bilateral Secured at: 21 cm Tube secured with: Tape Dental Injury: Teeth and Oropharynx as per pre-operative assessment

## 2021-01-20 NOTE — Progress Notes (Signed)
Elink monitoring for sepsis protocol 

## 2021-01-20 NOTE — Consult Note (Signed)
PHARMACY -  BRIEF ANTIBIOTIC NOTE   Pharmacy has received consult(s) for cefepime&vanco from an ED provider.  The patient's profile has been reviewed for ht/wt/allergies/indication/available labs.    One time order(s) placed for cefepime 2gms & vanco '1000mg'$  x 1 each  Further antibiotics/pharmacy consults should be ordered by admitting physician if indicated.                       Thank you, Berta Minor 01/20/2021  12:00 PM

## 2021-01-20 NOTE — Progress Notes (Signed)
CODE SEPSIS - PHARMACY COMMUNICATION  **Broad Spectrum Antibiotics should be administered within 1 hour of Sepsis diagnosis**  Time Code Sepsis Called/Page Received: 8/26 1159  Antibiotics Ordered: cefepime, vanc, metro  Time of 1st antibiotic administration: 1203  Additional action taken by pharmacy:    If necessary, Name of Provider/Nurse Contacted:      Noralee Space ,PharmD Clinical Pharmacist  01/20/2021  12:06 PM

## 2021-01-20 NOTE — ED Notes (Signed)
Patient transported to CT 

## 2021-01-20 NOTE — Anesthesia Preprocedure Evaluation (Addendum)
Anesthesia Evaluation  Patient identified by MRN, date of birth, ID band Patient awake    Reviewed: Allergy & Precautions, H&P , NPO status , Patient's Chart, lab work & pertinent test results, reviewed documented beta blocker date and time   History of Anesthesia Complications Negative for: history of anesthetic complications  Airway Mallampati: II  TM Distance: >3 FB Neck ROM: full    Dental  (+) Edentulous Upper, Upper Dentures, Dental Advidsory Given   Pulmonary neg shortness of breath, asthma , neg sleep apnea, neg recent URI,    Pulmonary exam normal breath sounds clear to auscultation       Cardiovascular Exercise Tolerance: Good negative cardio ROS Normal cardiovascular exam Rhythm:regular Rate:Normal     Neuro/Psych PSYCHIATRIC DISORDERS Anxiety negative neurological ROS     GI/Hepatic negative GI ROS, Neg liver ROS,   Endo/Other  negative endocrine ROS  Renal/GU negative Renal ROS  negative genitourinary   Musculoskeletal   Abdominal   Peds  Hematology negative hematology ROS (+)   Anesthesia Other Findings Past Medical History: No date: Anxiety No date: Arthritis No date: Asthma No date: HOH (hard of hearing) No date: Wears dentures     Comment:  full upper  Patient septic with lactate of 11.  Reproductive/Obstetrics negative OB ROS                            Anesthesia Physical Anesthesia Plan  ASA: 4 and emergent  Anesthesia Plan: General   Post-op Pain Management:    Induction: Intravenous  PONV Risk Score and Plan: 3 and Ondansetron, Dexamethasone and Treatment may vary due to age or medical condition  Airway Management Planned: Oral ETT  Additional Equipment:   Intra-op Plan:   Post-operative Plan: Extubation in OR  Informed Consent: I have reviewed the patients History and Physical, chart, labs and discussed the procedure including the risks, benefits  and alternatives for the proposed anesthesia with the patient or authorized representative who has indicated his/her understanding and acceptance.     Dental Advisory Given  Plan Discussed with: Anesthesiologist, CRNA and Surgeon  Anesthesia Plan Comments:        Anesthesia Quick Evaluation

## 2021-01-20 NOTE — H&P (Signed)
NAME:  Lisa Gamble, MRN:  [id number], DOB:  Oct 13, 1925, LOS: 0 ADMISSION DATE:  01/20/2021, CONSULTATION DATE:  01/20/2021 REFERRING MD:  Dr. Jari Pigg, CHIEF COMPLAINT:  Altered Mental Status   Brief Pt Description / Synopsis:  87 year old female admitted with septic shock in the setting of  urosepsis due to left ureteropelvic junction stone with hydronephrosis.  To go to the OR for emergent cystoscopy and left ureteral stent placement by Urology.  History of Present Illness:  Lisa Gamble is a 87 year old female with a past medical history as listed below who presented to [facility] ED on 01/20/2021 due to altered mental status and fever of 103.  Patient currently with altered mental status, therefore history is obtained from ED and nursing notes.  Per notes she did not answer her phone today from a mail delivery service which family was notified.  When family arrived they found her to be confused and not answering questions appropriately, oriented x1.  ED course: Initial vital signs: Temperature 103 Fahrenheit rectally, RR 22, pulse 133, blood pressure 95/67, SPO2 99% on room air Labs: Lactic acid 10.9, WBC 10.0 with neutrophilia, bicarbonate 19, anion gap 18, BUN 38, creatinine 1.44, glucose 158, AST 45, ALT 18, total bilirubin 1.4 Urinalysis consistent with urinary tract infection COVID-19 PCR is negative Imaging: CT abdomen pelvis: Concerning for 8 mm left proximal ureteral stone with upstream hydronephrosis and multiple renal stones, in addition to a cystic 7 cm lesion on the right ovary which has increased from prior, as well as profound periportal edema and intrahepatic biliary ductal dilation, and a large stool ball in the rectum. CT head and cervical spine: 1. Atrophy and chronic microvascular ischemic change. No acute intracranial abnormality 2. Advanced cervical spondylosis.  Negative for fracture. Chest x-ray: Negative for acute cardiopulmonary process  She met sepsis criteria  due to tachycardia, hypotension, and fever.  She received IV fluids and broad-spectrum antibiotics.  Urology was consulted and plans to proceed with emergent cystoscopy and left ureteral stent placement.  PCCM is asked to admit the patient.  She returns to the ICU post procedure and remains intubated with severe septic shock requiring vasopressors.  Pertinent  Medical History  Asthma Anxiety Arthritis Hard of hearing  Micro Data:  01/20/2021: SARS-CoV-2 and influenza PCR>> negative 01/20/2021: Blood culture x2>> 01/20/2021: Urine>>  Antimicrobials:  Cefepime 8/26>> Flagyl 8/26>> Vancomycin 8/26>>  Significant Hospital Events: Including procedures, antibiotic start and stop dates in addition to other pertinent events   01/20/2021: Presents to ED, found to have severe sepsis due to infected left kidney stone, going emergently to the OR for stent placement by urology  Interim History / Subjective:  -Returns from the PACU to ICU and remains intubated -Critically ill with severe septic shock requiring multiple vasopressors -Central line placed  Objective   Blood pressure (!) 86/54, pulse 96, temperature 100.2 F (37.9 C), temperature source Rectal, resp. rate 17, height '5\' 3"'  (1.6 m), weight 49 kg, SpO2 100 %.       No intake or output data in the 24 hours ending 01/20/21 1500 Filed Weights   01/20/21 1148  Weight: 49 kg    Examination: General: Critically ill-appearing female, laying in bed, intubated and sedated, no acute distress HENT: Atraumatic, normocephalic, neck supple, no JVD Lungs: Coarse breath sounds bilaterally, synchronous with the vent, even, nonlabored Cardiovascular: Regular rate and rhythm, S1-S2, no murmurs, rubs, gallops, 2+ distal pulses Abdomen: Soft, nontender, nondistended, no guarding rebound tenderness, bowel sounds positive x4 Extremities: No  deformities, no edema Neuro: Sedated, withdraws from pain, does not follow commands currently, pupils  PERRLA GU: Foley catheter in place draining yellow urine  Resolved Hospital Problem list     Assessment & Plan:   Septic Shock -Continuous cardiac monitoring -Maintain MAP >65 -IV fluids ~ follow CVP for further fluid resuscitation guidance -Vasopressors as needed to maintain MAP goal -Stress dose steroids  -Trend lactic acid until normalized -Trend HS Troponin until peaked -Consider echocardiogram  Severe Urosepsis due to to Left Ureteropelvic Junction Stone with Hydronephrosis  -Monitor fever curve -Trend WBC's & Procalcitonin -Follow cultures as above -Continue empiric Vancomycin & Zosyn pending cultures & sensitivities -To go to the OR for emergent cystoscopy and left ureteral stent placement by Urology  Postop respiratory failure due to severe sepsis and multiple metabolic derangements -Full vent support, implement lung protective strategies -Plateau pressures less than 30 cm H20 -Wean FiO2 & PEEP as tolerated to maintain O2 sats >92% -Follow intermittent Chest X-ray & ABG as needed -Spontaneous Breathing Trials when respiratory parameters met and mental status permits -Implement VAP Bundle -Prn Bronchodilators  Acute Kidney Injury Anion Gap Metabolic Acidosis in setting of Lactic Acidosis -Monitor I&O's / urinary output -Follow BMP -Ensure adequate renal perfusion -Avoid nephrotoxic agents as able -Replace electrolytes as indicated -IV fluids -Start bicarb drip  Acute Metabolic Encephalopathy due to Sepsis Sedation needs in the setting of mechanical ventilation -Maintain a RASS goal of 0 to -1 -Fentanyl and Precedex as needed to maintain RASS goal -Avoid sedating medications as able -Daily wake up assessment -CT Head negative for acute intracranial abnormality  CT Abdomen/Pelvis with periportal Edema and/or Intrahepatic Biliary ductal dilatation with no identified calculus or other obstructing etiology identified -T. Bilirubin is only slightly  elevated -Trend LFT's -Discussed with Dr. Mortimer Fries, will hold off on MRCP at this time  Questionable Right Ovarian Neoplasm -Once stabilized from severe Urosepsis, will need Ultrasound and further workup  Hyperglycemia -CBG's q4h; Target range of 140 to 180 -SSI -Follow ICU Hypo/Hyperglycemia protocol     Best Practice (right click and "Reselect all SmartList Selections" daily)   Diet/type: NPO DVT prophylaxis: SCD (once cleared by Urology, can start chemical prophylaxis following surgery) GI prophylaxis: PPI Lines: central line placed Foley:  yes, and is still needed Code Status:  full code Last date of multidisciplinary goals of care discussion [n/a]  Dr. Mortimer Fries updated pt's son at bedside.  All questions answered.  Labs   CBC: Recent Labs  Lab 01/20/21 1142  WBC 10.0  NEUTROABS 9.3*  HGB 13.1  HCT 39.9  MCV 94.1  PLT 284    Basic Metabolic Panel: Recent Labs  Lab 01/20/21 1142  NA 140  K 3.6  CL 103  CO2 19*  GLUCOSE 158*  BUN 38*  CREATININE 1.44*  CALCIUM 7.7*   GFR: Estimated Creatinine Clearance: 18.9 mL/min (A) (by C-G formula based on SCr of 1.44 mg/dL (H)). Recent Labs  Lab 01/20/21 1142 01/20/21 1340  WBC 10.0  --   LATICACIDVEN 10.9* 6.5*    Liver Function Tests: Recent Labs  Lab 01/20/21 1142  AST 45*  ALT 18  ALKPHOS 60  BILITOT 1.4*  PROT 5.3*  ALBUMIN 3.1*   No results for input(s): LIPASE, AMYLASE in the last 168 hours. No results for input(s): AMMONIA in the last 168 hours.  ABG No results found for: PHART, PCO2ART, PO2ART, HCO3, TCO2, ACIDBASEDEF, O2SAT   Coagulation Profile: Recent Labs  Lab 01/20/21 1142  INR 1.3*  Cardiac Enzymes: No results for input(s): CKTOTAL, CKMB, CKMBINDEX, TROPONINI in the last 168 hours.  HbA1C: No results found for: HGBA1C  CBG: No results for input(s): GLUCAP in the last 168 hours.  Review of Systems:   Unable to assess due to critical illness, intubation and sedation  Past  Medical History:  She,  has a past medical history of Anxiety, Arthritis, Asthma, HOH (hard of hearing), and Wears dentures.   Surgical History:   Past Surgical History:  Procedure Laterality Date   APPENDECTOMY     COLONOSCOPY     ORIF HIP FRACTURE Left    PTOSIS REPAIR Bilateral 12/31/2017   Procedure: PTOSIS REPAIR RESECT EX;  Surgeon: Karle Starch, MD;  Location: [facility];  Service: Ophthalmology;  Laterality: Bilateral;   TOTAL HIP ARTHROPLASTY Left      Social History:   reports that she has never smoked. She has never used smokeless tobacco. She reports that she does not currently use alcohol.   Family History:  Her family history is not on file.   Allergies Allergies  Allergen Reactions   Codeine     Doesn't remember reaction   Tramadol Diarrhea     Home Medications  Prior to Admission medications   Medication Sig Start Date End Date Taking? Authorizing Provider  albuterol (PROVENTIL HFA;VENTOLIN HFA) 108 (90 Base) MCG/ACT inhaler Inhale into the lungs every 6 (six) hours as needed for wheezing or shortness of breath.    [provider]  ALPRAZolam Duanne Moron) 0.5 MG tablet Take 0.5 mg by mouth at bedtime as needed for anxiety.    [provider]  B Complex Vitamins (VITAMIN B COMPLEX PO) Take by mouth daily.    [provider]  Carboxymethylcellulose Sodium (LUBRICANT EYE DROPS OP) Apply to eye daily.    [provider]  Cholecalciferol (VITAMIN D3) 2000 units TABS Take by mouth daily.    [provider]  erythromycin ophthalmic ointment Apply to sutures 4 times a day for 10-12 days.  Discontinue if allergy develops and call our office 12/31/17   Karle Starch, MD  FOLIC ACID PO Take by mouth daily.    [provider]  Melatonin 3 MG TABS Take by mouth at bedtime as needed.    [provider]  vitamin B-12 (CYANOCOBALAMIN) 1000 MCG tablet Take 1,500 mcg by mouth daily.    [provider]      Critical care time: 60 minutes     Darel Hong, AGACNP-BC [facility] epic messenger for cross cover needs If after hours, please call E-link

## 2021-01-20 NOTE — Procedures (Signed)
Central Venous Catheter Insertion Procedure Note  Lisa Gamble  [id number]  1926/04/04  Date:01/20/21  Time:5:35 PM   Provider Performing:Eriyonna Matsushita D Dewaine Conger   Procedure: Insertion of Non-tunneled Central Venous Catheter(36556) with US guidance [id number])   Indication(s) Medication administration and Difficult access  Consent Unable to obtain consent due to emergent nature of procedure.  Anesthesia Topical only with 1% lidocaine   Timeout Verified patient identification, verified procedure, site/side was marked, verified correct patient position, special equipment/implants available, medications/allergies/relevant history reviewed, required imaging and test results available.  Sterile Technique Maximal sterile technique including full sterile barrier drape, hand hygiene, sterile gown, sterile gloves, mask, hair covering, sterile ultrasound probe cover (if used).  Procedure Description Area of catheter insertion was cleaned with chlorhexidine and draped in sterile fashion.  With real-time ultrasound guidance a central venous catheter was placed into the left internal jugular vein. Nonpulsatile blood flow and easy flushing noted in all ports.  The catheter was sutured in place and sterile dressing applied.  Complications/Tolerance None; patient tolerated the procedure well. Chest X-ray is ordered to verify placement for internal jugular or subclavian cannulation.   Chest x-ray is not ordered for femoral cannulation.  EBL Minimal  Specimen(s) None   Line secured at the 20 cm mark.  BIOPATCH applied to the insertion site.     Darel Hong, AGACNP-BC [facility] Prefer epic messenger for cross cover needs If after hours, please call E-link

## 2021-01-20 NOTE — Anesthesia Postprocedure Evaluation (Signed)
Anesthesia Post Note  Patient: Lisa Gamble  Procedure(s) Performed: CYSTOSCOPY WITH STENT PLACEMENT (Left)  Patient location during evaluation: PACU Anesthesia Type: General Level of consciousness: awake and alert Pain management: pain level controlled Vital Signs Assessment: post-procedure vital signs reviewed and stable Respiratory status: spontaneous breathing, nonlabored ventilation, respiratory function stable and patient connected to nasal cannula oxygen Cardiovascular status: blood pressure returned to baseline and stable Postop Assessment: no apparent nausea or vomiting Anesthetic complications: no   No notable events documented.   Last Vitals:  Vitals:   01/20/21 1800 01/20/21 1938  BP: 131/85   Pulse: 78   Resp: 14   Temp:    SpO2: 99% 98%    Last Pain:  Vitals:   01/20/21 1653  TempSrc: Axillary  PainSc:                  Martha Clan

## 2021-01-20 NOTE — Op Note (Signed)
Date of procedure: 01/20/21  Preoperative diagnosis:  Sepsis from urinary source Left proximal ureteral stone  Postoperative diagnosis:  Same  Procedure: Cystoscopy, left retrograde pyelogram with intraoperative interpretation, left ureteral stent placement  Surgeon: Nickolas Madrid, MD  Anesthesia: General  Complications: None  Intraoperative findings:  Normal cystoscopy, purulent urine with left ureteral stent placement  EBL: Minimal  Specimens: None  Drains: Left 6 French by 24 cm Bard Optima stent  Indication: Lisa Gamble is a 87 y.o. patient with severe sepsis from urinary source and 8 mm left proximal ureteral stone and multiple large left renal stones.  After reviewing the management options for treatment, they elected to proceed with the above surgical procedure(s). We have discussed the potential benefits and risks of the procedure, side effects of the proposed treatment, the likelihood of the patient achieving the goals of the procedure, and any potential problems that might occur during the procedure or recuperation. Informed consent has been obtained.  Description of procedure:  The patient was taken to the operating room and general anesthesia was induced. SCDs were placed for DVT prophylaxis. The patient was placed in the dorsal lithotomy position, prepped and draped in the usual sterile fashion, and preoperative antibiotics(cefepime and vancomycin in the ER) were administered. A preoperative time-out was performed.   A 21 French rigid cystoscope was used to intubate the urethra and urine was dark tea colored.  The bladder was irrigated multiple times to improve vision.  The ureteral orifices were orthotopic bilaterally, and there were no suspicious lesions.  A left retrograde pyelogram was performed with a ureteral access catheter and showed an 8 mm UPJ stone with upstream hydronephrosis.  With the aid of the access catheter a sensor wire was navigated alongside  the stone into the midpole of the kidney.  A 6 French by 24 cm Bard Optima stent was uneventfully placed with an excellent curl in the mid pole of the kidney, as well as under direct vision the bladder.  There was purulent drainage through the side ports of the stent.  An 66 French Foley was placed with 10 mL in the balloon, and connected to drainage  Disposition: Guarded, remains intubated to ICU  Plan: Remains intubated to ICU for antibiotics and resuscitation Maintain Foley until clinically improved Will arrange urology follow-up in 2 to 3 weeks pending her improvement to discuss left ureteroscopy/laser lithotripsy versus observation or stent exchange  Nickolas Madrid, MD

## 2021-01-20 NOTE — Consult Note (Signed)
CODE SEPSIS - PHARMACY COMMUNICATION  **Broad Spectrum Antibiotics should be administered within 1 hour of Sepsis diagnosis**  Time Code Sepsis Called/Page Received: 1152  Antibiotics Ordered: cefepime &vanc  Time of 1st antibiotic administration: 1203  Additional action taken by pharmacy: N/A  If necessary, Name of Provider/Nurse Contacted: [place] ,PharmD Clinical Pharmacist  01/20/2021  11:57 AM

## 2021-01-20 NOTE — Consult Note (Signed)
Pharmacy Antibiotic Note  Lisa Gamble is a 87 y.o. female admitted on 01/20/2021 with sepsis from urinary source and left UPJ stone with hydronephrosis. Pharmacy has been consulted for Vancomycin + Zosyn dosing.  Plan: After discussion with Darel Hong, antibiotics will be changed Zosyn --> cefepime + metronidazole. Cefepime 2 gram Q24H based on current renal function Vancomycin 1000 mg LD x 1 given in ED Hold off on scheduled Vancomycin regimen based on AKI Estimated dosing interval Q48H, T1/2 ~ 34 hours Follow up cultures for de-escalation  Height: '5\' 3"'$  (160 cm) Weight: 49 kg (108 lb 0.4 oz) IBW/kg (Calculated) : 52.4  Temp (24hrs), Avg:99.2 F (37.3 C), Min:97 F (36.1 C), Max:103.3 F (39.6 C)  Recent Labs  Lab 01/20/21 1142 01/20/21 1340  WBC 10.0  --   CREATININE 1.44*  --   LATICACIDVEN 10.9* 6.5*    Estimated Creatinine Clearance: 18.9 mL/min (A) (by C-G formula based on SCr of 1.44 mg/dL (H)).    Allergies  Allergen Reactions   Amitriptyline     Other reaction(s): Unknown Onset 01/06/1999.   Codeine     Doesn't remember reaction   Lithium     Other reaction(s): Unknown Onset 07/24/2009   Tetracycline     Other reaction(s): Unknown Onset 01/06/1999.   Tramadol Diarrhea   Trazodone     Other reaction(s): Unknown Onset 07/25/2009.   Venlafaxine     Other reaction(s): Unknown Onset 01/06/1999.    Antimicrobials this admission: 8/26 Vancomycin >>  8/26 cefepime >>  8/26 metronidazole >>  Dose adjustments this admission: N/a  Microbiology results: 8/26 BCx: sent 8/26 UCx: sent    Thank you for allowing pharmacy to be a part of this patient's care.  Dorothe Pea, PharmD, BCPS Clinical Pharmacist   01/20/2021 4:01 PM

## 2021-01-20 NOTE — ED Triage Notes (Addendum)
Pt presents to the ED via EMS from home as Code Sepsis. EMS states that they were notified by pt's family after a meal delivery service was unable to deliver food after pt did did not answer the door. Family was notified who found pt to be confused and not answering questions appropriately. Pt only oriented to self at this time.

## 2021-01-21 DIAGNOSIS — N200 Calculus of kidney: Secondary | ICD-10-CM | POA: Diagnosis not present

## 2021-01-21 DIAGNOSIS — R6521 Severe sepsis with septic shock: Secondary | ICD-10-CM | POA: Diagnosis not present

## 2021-01-21 DIAGNOSIS — A419 Sepsis, unspecified organism: Secondary | ICD-10-CM | POA: Diagnosis not present

## 2021-01-21 LAB — BLOOD GAS, ARTERIAL
Acid-base deficit: 8 mmol/L — ABNORMAL HIGH (ref 0.0–2.0)
Bicarbonate: 16.9 mmol/L — ABNORMAL LOW (ref 20.0–28.0)
FIO2: 35
MECHVT: 450 mL
Mechanical Rate: 14
O2 Saturation: 98.7 %
PEEP: 5 cmH2O
Patient temperature: 37
pCO2 arterial: 32 mmHg (ref 32.0–48.0)
pH, Arterial: 7.33 — ABNORMAL LOW (ref 7.350–7.450)
pO2, Arterial: 129 mmHg — ABNORMAL HIGH (ref 83.0–108.0)

## 2021-01-21 LAB — CBC WITH DIFFERENTIAL/PLATELET
Abs Immature Granulocytes: 2.44 10*3/uL — ABNORMAL HIGH (ref 0.00–0.07)
Basophils Absolute: 0 10*3/uL (ref 0.0–0.1)
Basophils Relative: 0 %
Eosinophils Absolute: 0.1 10*3/uL (ref 0.0–0.5)
Eosinophils Relative: 0 %
HCT: 35.3 % — ABNORMAL LOW (ref 36.0–46.0)
Hemoglobin: 11.8 g/dL — ABNORMAL LOW (ref 12.0–15.0)
Immature Granulocytes: 5 %
Lymphocytes Relative: 1 %
Lymphs Abs: 0.4 10*3/uL — ABNORMAL LOW (ref 0.7–4.0)
MCH: 31.3 pg (ref 26.0–34.0)
MCHC: 33.4 g/dL (ref 30.0–36.0)
MCV: 93.6 fL (ref 80.0–100.0)
Monocytes Absolute: 1.6 10*3/uL — ABNORMAL HIGH (ref 0.1–1.0)
Monocytes Relative: 3 %
Neutro Abs: 42.2 10*3/uL — ABNORMAL HIGH (ref 1.7–7.7)
Neutrophils Relative %: 91 %
Platelets: 168 10*3/uL (ref 150–400)
RBC: 3.77 MIL/uL — ABNORMAL LOW (ref 3.87–5.11)
RDW: 13.2 % (ref 11.5–15.5)
Smear Review: NORMAL
WBC: 47.1 10*3/uL — ABNORMAL HIGH (ref 4.0–10.5)
nRBC: 0 % (ref 0.0–0.2)

## 2021-01-21 LAB — BLOOD CULTURE ID PANEL (REFLEXED) - BCID2

## 2021-01-21 LAB — COMPREHENSIVE METABOLIC PANEL
ALT: 17 U/L (ref 0–44)
AST: 35 U/L (ref 15–41)
Albumin: 2.3 g/dL — ABNORMAL LOW (ref 3.5–5.0)
Alkaline Phosphatase: 45 U/L (ref 38–126)
Anion gap: 7 (ref 5–15)
BUN: 31 mg/dL — ABNORMAL HIGH (ref 8–23)
CO2: 21 mmol/L — ABNORMAL LOW (ref 22–32)
Calcium: 7 mg/dL — ABNORMAL LOW (ref 8.9–10.3)
Chloride: 109 mmol/L (ref 98–111)
Creatinine, Ser: 0.98 mg/dL (ref 0.44–1.00)
GFR, Estimated: 54 mL/min — ABNORMAL LOW (ref >60)
Glucose, Bld: 179 mg/dL — ABNORMAL HIGH (ref 70–99)
Potassium: 3.3 mmol/L — ABNORMAL LOW (ref 3.5–5.1)
Sodium: 137 mmol/L (ref 135–145)
Total Bilirubin: 0.9 mg/dL (ref 0.3–1.2)
Total Protein: 4.1 g/dL — ABNORMAL LOW (ref 6.5–8.1)

## 2021-01-21 LAB — GLUCOSE, CAPILLARY
Glucose-Capillary: 188 mg/dL — ABNORMAL HIGH (ref 70–99)
Glucose-Capillary: 138 mg/dL — ABNORMAL HIGH (ref 70–99)
Glucose-Capillary: 128 mg/dL — ABNORMAL HIGH (ref 70–99)
Glucose-Capillary: 88 mg/dL (ref 70–99)
Glucose-Capillary: 68 mg/dL — ABNORMAL LOW (ref 70–99)
Glucose-Capillary: 94 mg/dL (ref 70–99)

## 2021-01-21 LAB — HEMOGLOBIN A1C
Hgb A1c MFr Bld: 5.6 % (ref 4.8–5.6)
Mean Plasma Glucose: 114.02 mg/dL

## 2021-01-21 LAB — MAGNESIUM: Magnesium: 2.4 mg/dL (ref 1.7–2.4)

## 2021-01-21 LAB — LACTIC ACID, PLASMA
Lactic Acid, Venous: 5.6 mmol/L (ref 0.5–1.9)
Lactic Acid, Venous: 5.1 mmol/L (ref 0.5–1.9)

## 2021-01-21 LAB — PHOSPHORUS: Phosphorus: 3.3 mg/dL (ref 2.5–4.6)

## 2021-01-21 LAB — PROCALCITONIN: Procalcitonin: >150 ng/mL

## 2021-01-21 MED ORDER — SODIUM CHLORIDE 0.9 % IV SOLN
2.0000 g | INTRAVENOUS | Status: DC
  Administered 2021-01-21 – 2021-01-22 (×2): 2 g via INTRAVENOUS
  Filled 2021-01-21: qty 20
  Filled 2021-01-21: qty 2
  Filled 2021-01-21: qty 20

## 2021-01-21 MED ORDER — POTASSIUM CHLORIDE 10 MEQ/50ML IV SOLN
10.0000 meq | INTRAVENOUS | Status: AC
  Administered 2021-01-21 (×4): 10 meq via INTRAVENOUS
  Filled 2021-01-21 (×4): qty 50

## 2021-01-21 MED ORDER — DEXTROSE 50 % IV SOLN
25.0000 mL | Freq: Once | INTRAVENOUS | Status: AC
  Administered 2021-01-21: 25 mL via INTRAVENOUS
  Filled 2021-01-21: qty 50

## 2021-01-21 MED ORDER — INSULIN ASPART 100 UNIT/ML IJ SOLN
0.0000 [IU] | INTRAMUSCULAR | Status: DC
  Administered 2021-01-21 (×2): 2 [IU] via SUBCUTANEOUS
  Administered 2021-01-21: 3 [IU] via SUBCUTANEOUS
  Filled 2021-01-21 (×3): qty 1

## 2021-01-21 MED ORDER — POTASSIUM CHLORIDE 20 MEQ PO PACK
20.0000 meq | PACK | ORAL | Status: AC
  Administered 2021-01-21 (×2): 20 meq
  Filled 2021-01-21 (×2): qty 1

## 2021-01-21 MED ORDER — LACTATED RINGERS IV BOLUS
1000.0000 mL | Freq: Once | INTRAVENOUS | Status: AC
  Administered 2021-01-21: 1000 mL via INTRAVENOUS

## 2021-01-21 NOTE — Progress Notes (Signed)
GOALS OF CARE DISCUSSION  The Clinical status was relayed to family in detail. Son and Daughter at bedside Updated and notified of patients medical condition.    Patient remains unresponsive and will not open eyes to command.   Severe septic shock Extubated On pressors E coli bacteremia on cultures  Explained to family course of therapy and the modalities    Patient with Progressive multiorgan failure with a very high probablity of a very minimal chance of meaningful recovery despite all aggressive and optimal medical therapy.   Family understands the situation.  They have consented and agreed to DNR/DNI   Family are satisfied with Plan of action and management. All questions answered  Additional CC time 35 mins   Lisa Gamble Lisa Gamble, M.D.  [facility]  Medical Director [place] Director [facility] Cardio-Pulmonary Department

## 2021-01-21 NOTE — Progress Notes (Signed)
PHARMACY - PHYSICIAN COMMUNICATION CRITICAL VALUE ALERT - BLOOD CULTURE IDENTIFICATION (BCID)  Lisa Gamble is an 87 y.o. female who presented to [facility] on 01/20/2021 with a chief complaint of sepsis, left ureteral stone, and AKI.  Assessment: Suspected urinary source. Blood culture growing 1/4 bottles gram negative rods in anaerobic bottle. BCID detected Enterobacteriales, specifically E. Coli, without resistance.  Name of physician (or Provider) Contacted: Dr. Mortimer Fries  Current antibiotics: cefepime 2 grams IV every 24 hours  Changes to prescribed antibiotics recommended: ceftriaxone 2 grams IV every 24 hours Recommendations accepted by provider   Results for orders placed or performed during the hospital encounter of 01/20/21  Blood Culture ID Panel (Reflexed) (Collected: 01/20/2021 11:42 AM)  Result Value Ref Range   Enterococcus faecalis NOT DETECTED NOT DETECTED   Enterococcus Faecium NOT DETECTED NOT DETECTED   Listeria monocytogenes NOT DETECTED NOT DETECTED   Staphylococcus species NOT DETECTED NOT DETECTED   Staphylococcus aureus (BCID) NOT DETECTED NOT DETECTED   Staphylococcus epidermidis NOT DETECTED NOT DETECTED   Staphylococcus lugdunensis NOT DETECTED NOT DETECTED   Streptococcus species NOT DETECTED NOT DETECTED   Streptococcus agalactiae NOT DETECTED NOT DETECTED   Streptococcus pneumoniae NOT DETECTED NOT DETECTED   Streptococcus pyogenes NOT DETECTED NOT DETECTED   A.calcoaceticus-baumannii NOT DETECTED NOT DETECTED   Bacteroides fragilis NOT DETECTED NOT DETECTED   Enterobacterales DETECTED (A) NOT DETECTED   Enterobacter cloacae complex NOT DETECTED NOT DETECTED   Escherichia coli DETECTED (A) NOT DETECTED   Klebsiella aerogenes NOT DETECTED NOT DETECTED   Klebsiella oxytoca NOT DETECTED NOT DETECTED   Klebsiella pneumoniae NOT DETECTED NOT DETECTED   Proteus species NOT DETECTED NOT DETECTED   Salmonella species NOT DETECTED NOT DETECTED   Serratia  marcescens NOT DETECTED NOT DETECTED   Haemophilus influenzae NOT DETECTED NOT DETECTED   Neisseria meningitidis NOT DETECTED NOT DETECTED   Pseudomonas aeruginosa NOT DETECTED NOT DETECTED   Stenotrophomonas maltophilia NOT DETECTED NOT DETECTED   Candida albicans NOT DETECTED NOT DETECTED   Candida auris NOT DETECTED NOT DETECTED   Candida glabrata NOT DETECTED NOT DETECTED   Candida krusei NOT DETECTED NOT DETECTED   Candida parapsilosis NOT DETECTED NOT DETECTED   Candida tropicalis NOT DETECTED NOT DETECTED   Cryptococcus neoformans/gattii NOT DETECTED NOT DETECTED   CTX-M ESBL NOT DETECTED NOT DETECTED   Carbapenem resistance IMP NOT DETECTED NOT DETECTED   Carbapenem resistance KPC NOT DETECTED NOT DETECTED   Carbapenem resistance NDM NOT DETECTED NOT DETECTED   Carbapenem resist OXA 48 LIKE NOT DETECTED NOT DETECTED   Carbapenem resistance VIM NOT DETECTED NOT [place], PharmD Pharmacy Resident  01/21/2021 12:00 PM

## 2021-01-21 NOTE — Plan of Care (Signed)
Problem: Activity: Goal: Ability to tolerate increased activity will improve 01/21/2021 1612 by Mammie Lorenzo, RN Outcome: Completed/Met 01/21/2021 1059 by Mammie Lorenzo, RN Outcome: Progressing   Problem: Respiratory: Goal: Ability to maintain a clear airway and adequate ventilation will improve 01/21/2021 1612 by Mammie Lorenzo, RN Outcome: Completed/Met 01/21/2021 1059 by Mammie Lorenzo, RN Outcome: Progressing   Problem: Role Relationship: Goal: Method of communication will improve 01/21/2021 1612 by Mammie Lorenzo, RN Outcome: Completed/Met 01/21/2021 1059 by Mammie Lorenzo, RN Outcome: Progressing   Problem: Education: Goal: Knowledge of General Education information will improve Description: Including pain rating scale, medication(s)/side effects and non-pharmacologic comfort measures 01/21/2021 1619 by Mammie Lorenzo, RN Outcome: Progressing 01/21/2021 1612 by Mammie Lorenzo, RN Outcome: Progressing 01/21/2021 1059 by Mammie Lorenzo, RN Outcome: Progressing   Problem: Health Behavior/Discharge Planning: Goal: Ability to manage health-related needs will improve 01/21/2021 1619 by Mammie Lorenzo, RN Outcome: Progressing 01/21/2021 1612 by Mammie Lorenzo, RN Outcome: Progressing 01/21/2021 1059 by Mammie Lorenzo, RN Outcome: Progressing   Problem: Clinical Measurements: Goal: Ability to maintain clinical measurements within normal limits will improve 01/21/2021 1619 by Mammie Lorenzo, RN Outcome: Progressing 01/21/2021 1612 by Mammie Lorenzo, RN Outcome: Progressing 01/21/2021 1059 by Mammie Lorenzo, RN Outcome: Progressing Goal: Will remain free from infection 01/21/2021 1619 by Mammie Lorenzo, RN Outcome: Progressing 01/21/2021 1612 by Mammie Lorenzo, RN Outcome: Progressing 01/21/2021 1059 by Mammie Lorenzo, RN Outcome: Progressing Goal: Diagnostic test results will improve 01/21/2021 1619 by Mammie Lorenzo, RN Outcome: Progressing 01/21/2021 1612 by Mammie Lorenzo, RN Outcome: Progressing 01/21/2021 1059 by Mammie Lorenzo,  RN Outcome: Progressing Goal: Respiratory complications will improve 01/21/2021 1619 by Mammie Lorenzo, RN Outcome: Progressing 01/21/2021 1612 by Mammie Lorenzo, RN Outcome: Progressing 01/21/2021 1059 by Mammie Lorenzo, RN Outcome: Progressing Goal: Cardiovascular complication will be avoided 01/21/2021 1619 by Mammie Lorenzo, RN Outcome: Progressing 01/21/2021 1612 by Mammie Lorenzo, RN Outcome: Progressing 01/21/2021 1059 by Mammie Lorenzo, RN Outcome: Progressing   Problem: Activity: Goal: Risk for activity intolerance will decrease 01/21/2021 1619 by Mammie Lorenzo, RN Outcome: Progressing 01/21/2021 1612 by Mammie Lorenzo, RN Outcome: Progressing 01/21/2021 1059 by Mammie Lorenzo, RN Outcome: Progressing   Problem: Nutrition: Goal: Adequate nutrition will be maintained 01/21/2021 1619 by Mammie Lorenzo, RN Outcome: Progressing 01/21/2021 1612 by Mammie Lorenzo, RN Outcome: Progressing 01/21/2021 1059 by Mammie Lorenzo, RN Outcome: Progressing   Problem: Coping: Goal: Level of anxiety will decrease 01/21/2021 1619 by Mammie Lorenzo, RN Outcome: Progressing 01/21/2021 1612 by Mammie Lorenzo, RN Outcome: Progressing 01/21/2021 1059 by Mammie Lorenzo, RN Outcome: Progressing   Problem: Elimination: Goal: Will not experience complications related to bowel motility 01/21/2021 1619 by Mammie Lorenzo, RN Outcome: Progressing 01/21/2021 1612 by Mammie Lorenzo, RN Outcome: Progressing 01/21/2021 1059 by Mammie Lorenzo, RN Outcome: Progressing Goal: Will not experience complications related to urinary retention 01/21/2021 1619 by Mammie Lorenzo, RN Outcome: Progressing 01/21/2021 1612 by Mammie Lorenzo, RN Outcome: Progressing 01/21/2021 1059 by Mammie Lorenzo, RN Outcome: Progressing   Problem: Pain Managment: Goal: General experience of comfort will improve 01/21/2021 1619 by Mammie Lorenzo, RN Outcome: Progressing 01/21/2021 1612 by Mammie Lorenzo, RN Outcome: Progressing 01/21/2021 1059 by Mammie Lorenzo, RN Outcome: Progressing   Problem:  Safety: Goal: Ability to remain free from injury will improve 01/21/2021 1619 by Mammie Lorenzo, RN Outcome: Progressing 01/21/2021 1612 by Mammie Lorenzo, RN Outcome: Progressing 01/21/2021 1059 by Mammie Lorenzo, RN Outcome: Progressing   Problem: Skin Integrity: Goal: Risk for impaired skin integrity will decrease 01/21/2021 1619 by Mammie Lorenzo, RN Outcome: Progressing 01/21/2021 1612  by Mammie Lorenzo, RN Outcome: Progressing 01/21/2021 1059 by Mammie Lorenzo, RN Outcome: Progressing

## 2021-01-21 NOTE — Progress Notes (Signed)
Ms. Fazzi was extubated to room air at 1333.  She is resting with an O2 sat of 100%, RR of 16 and HR of 80.

## 2021-01-21 NOTE — Progress Notes (Signed)
NAME:  Lisa Gamble, MRN:  [id number], DOB:  [birth year], LOS: 1 ADMISSION DATE:  01/20/2021, CONSULTATION DATE:  01/20/2021 REFERRING MD:  Dr. Jari Pigg, CHIEF COMPLAINT:  Altered Mental Status   Brief Pt Description / Synopsis:  87 year old female admitted with septic shock in the setting of  urosepsis due to left ureteropelvic junction stone with hydronephrosis.  To go to the OR for emergent cystoscopy and left ureteral stent placement by Urology.  History of Present Illness:  Lisa Gamble is a 87 year old female with a past medical history as listed below who presented to [facility] ED on 01/20/2021 due to altered mental status and fever of 103.  Patient currently with altered mental status, therefore history is obtained from ED and nursing notes.  Per notes she did not answer her phone today from a mail delivery service which family was notified.  When family arrived they found her to be confused and not answering questions appropriately, oriented x1.  ED course: Initial vital signs: Temperature 103 Fahrenheit rectally, RR 22, pulse 133, blood pressure 95/67, SPO2 99% on room air Labs: Lactic acid 10.9, WBC 10.0 with neutrophilia, bicarbonate 19, anion gap 18, BUN 38, creatinine 1.44, glucose 158, AST 45, ALT 18, total bilirubin 1.4 Urinalysis consistent with urinary tract infection COVID-19 PCR is negative Imaging: CT abdomen pelvis: Concerning for 8 mm left proximal ureteral stone with upstream hydronephrosis and multiple renal stones, in addition to a cystic 7 cm lesion on the right ovary which has increased from prior, as well as profound periportal edema and intrahepatic biliary ductal dilation, and a large stool ball in the rectum. CT head and cervical spine: 1. Atrophy and chronic microvascular ischemic change. No acute intracranial abnormality 2. Advanced cervical spondylosis.  Negative for fracture. Chest x-ray: Negative for acute cardiopulmonary process  She met sepsis criteria  due to tachycardia, hypotension, and fever.  She received IV fluids and broad-spectrum antibiotics.  Urology was consulted and plans to proceed with emergent cystoscopy and left ureteral stent placement.  PCCM is asked to admit the patient.  She returns to the ICU post procedure and remains intubated with severe septic shock requiring vasopressors.  Pertinent  Medical History  Asthma Anxiety Arthritis Hard of hearing  Micro Data:  01/20/2021: SARS-CoV-2 and influenza PCR>> negative 01/20/2021: Blood culture x2>> 01/20/2021: Urine>>  Antimicrobials:  Cefepime 8/26>> Flagyl 8/26>> Vancomycin 8/26>>  Significant Hospital Events: Including procedures, antibiotic start and stop dates in addition to other pertinent events   01/20/2021: Presents to ED, found to have severe sepsis due to infected left kidney stone, going emergently to the OR for stent placement by urology 8/27 remains on pressors, severe acidosis  Interim History / Subjective:  Severe septic shock Resp failure On vent Elevated troponins +ischemic cardiomyopathy   Objective   Blood pressure 97/60, pulse 68, temperature 97.6 F (36.4 C), temperature source Axillary, resp. rate 14, height $RemoveBe'5\' 3"'ThqmHXWES$  (1.6 m), weight 56.2 kg, SpO2 98 %. CVP:  [7 mmHg-9 mmHg] 7 mmHg  Vent Mode: PRVC FiO2 (%):  [35 %] 35 % Set Rate:  [14 bmp] 14 bmp Vt Set:  [450 mL] 450 mL PEEP:  [5 cmH20] 5 cmH20 Plateau Pressure:  [15 cmH20-18 cmH20] 18 cmH20   Intake/Output Summary (Last 24 hours) at 01/21/2021 0731 Last data filed at 01/21/2021 0400 Gross per 24 hour  Intake 2395.31 ml  Output 450 ml  Net 1945.31 ml   Filed Weights   01/20/21 1514 01/20/21 1653 01/21/21 0318  Weight: 49 kg  52.3 kg 56.2 kg    REVIEW OF SYSTEMS  PATIENT IS UNABLE TO PROVIDE COMPLETE REVIEW OF SYSTEMS DUE TO SEVERE CRITICAL ILLNESS AND TOXIC METABOLIC ENCEPHALOPATHY PHYSICAL EXAMINATION:  GENERAL:critically ill appearing, +resp distress EYES: Pupils equal,  round, reactive to light.  No scleral icterus.  MOUTH: Moist mucosal membrane. INTUBATED NECK: Supple.  PULMONARY: +rhonchi, +wheezing CARDIOVASCULAR: S1 and S2.  No murmurs  GASTROINTESTINAL: Soft, nontender, -distended. Positive bowel sounds.  MUSCULOSKELETAL: No swelling, clubbing, or edema.  NEUROLOGIC: obtunded SKIN:intact,warm,dry      Assessment & Plan:  87 yo thin frail white female with severe septic shock and acidosis with metabolic encephalopathy with acute left kidney infection with infected kidney stone   Severe ACUTE Hypoxic and Hypercapnic Respiratory Failure -continue Mechanical Ventilator support -continue Bronchodilator Therapy -Wean Fio2 and PEEP as tolerated -VAP/VENT bundle implementation -will perform SAT/SBT when respiratory parameters are met  SEPTIC shock SOURCE- -use vasopressors to keep MAP>65 as needed -follow ABG and LA as needed -follow up cultures -emperic ABX -consider stress dose steroids -aggressive IV fluid Resuscitation  INFECTIOUS DISEASE Severe Urosepsis due to to Left Ureteropelvic Junction Stone with Hydronephrosis  -continue antibiotics as prescribed -follow up cultures -follow up ID consultation  ACUTE KIDNEY INJURY/Renal Failure -continue Foley Catheter-assess need -Avoid nephrotoxic agents -Follow urine output, BMP -Ensure adequate renal perfusion, optimize oxygenation -Renal dose medications   Intake/Output Summary (Last 24 hours) at 01/21/2021 0734 Last data filed at 01/21/2021 0400 Gross per 24 hour  Intake 2395.31 ml  Output 450 ml  Net 1945.31 ml     NEUROLOGY ACUTE TOXIC METABOLIC ENCEPHALOPATHY -need for sedation -Goal RASS -2 to -3   Questionable Right Ovarian Neoplasm -Once stabilized from severe Urosepsis, will need Ultrasound and further workup     Best Practice (right click and "Reselect all SmartList Selections" daily)   Diet/type: NPO DVT prophylaxis: SCD (once cleared by Urology, can start  chemical prophylaxis following surgery) GI prophylaxis: PPI Lines: central line placed Foley:  yes, and is still needed Code Status:  full code Last date of multidisciplinary goals of care discussion [n/a]   Labs   CBC: Recent Labs  Lab 01/20/21 1142 01/21/21 0417  WBC 10.0 47.1*  NEUTROABS 9.3* 42.2*  HGB 13.1 11.8*  HCT 39.9 35.3*  MCV 94.1 93.6  PLT 165 168     Basic Metabolic Panel: Recent Labs  Lab 01/20/21 1142 01/20/21 1809 01/20/21 1900 01/21/21 0417  NA 140  --   --  137  K 3.6  --  3.1* 3.3*  CL 103  --   --  109  CO2 19*  --   --  21*  GLUCOSE 158*  --   --  179*  BUN 38*  --   --  31*  CREATININE 1.44*  --   --  0.98  CALCIUM 7.7*  --  6.5* 7.0*  MG  --   --  1.4* 2.4  PHOS  --  3.4  --  3.3    GFR: Estimated Creatinine Clearance: 29.7 mL/min (by C-G formula based on SCr of 0.98 mg/dL). Recent Labs  Lab 01/20/21 1142 01/20/21 1340 01/20/21 1809 01/20/21 1940 01/20/21 2057 01/21/21 0417  PROCALCITON 58.25  --   --   --   --  >150.00  WBC 10.0  --   --   --   --  47.1*  LATICACIDVEN 10.9*   < > 5.6* 5.8* 6.5* 5.6*   < > = values in this interval not  displayed.     Liver Function Tests: Recent Labs  Lab 01/20/21 1142 01/21/21 0417  AST 45* 35  ALT 18 17  ALKPHOS 60 45  BILITOT 1.4* 0.9  PROT 5.3* 4.1*  ALBUMIN 3.1* 2.3*    No results for input(s): LIPASE, AMYLASE in the last 168 hours. No results for input(s): AMMONIA in the last 168 hours.  ABG    Component Value Date/Time   PHART 7.33 (L) 01/21/2021 0442   PCO2ART 32 01/21/2021 0442   PO2ART 129 (H) 01/21/2021 0442   HCO3 16.9 (L) 01/21/2021 0442   ACIDBASEDEF 8.0 (H) 01/21/2021 0442   O2SAT 98.7 01/21/2021 0442     Coagulation Profile: Recent Labs  Lab 01/20/21 1142  INR 1.3*      Recent Labs  Lab 01/20/21 2029 01/20/21 2308 01/21/21 0309  GLUCAP 190* 188* 188*     DVT/GI PRX  assessed I Assessed the need for Labs I Assessed the need for Foley I  Assessed the need for Central Venous Line Family Discussion when available I Assessed the need for Mobilization I made an Assessment of medications to be adjusted accordingly Safety Risk assessment completed  CASE DISCUSSED IN MULTIDISCIPLINARY ROUNDS WITH ICU TEAM     Critical Care Time devoted to patient care services described in this note is 65 minutes.  Critical care was necessary to treat /prevent imminent and life-threatening deterioration. Overall, patient is critically ill, prognosis is guarded.  Patient with Multiorgan failure and at high risk for cardiac arrest and death.    Corrin Parker, M.D.  [facility]  Medical Director [place] Director [facility] Cardio-Pulmonary Department

## 2021-01-21 NOTE — Consult Note (Signed)
PHARMACY CONSULT NOTE - FOLLOW UP  Pharmacy Consult for Electrolyte Monitoring and Replacement   Recent Labs: Potassium (mmol/L)  Date Value  01/21/2021 3.3 (L)  02/07/2013 3.7   Magnesium (mg/dL)  Date Value  01/21/2021 2.4   Calcium (mg/dL)  Date Value  01/21/2021 7.0 (L)   Calcium, Total (mg/dL)  Date Value  02/07/2013 8.5   Albumin (g/dL)  Date Value  01/21/2021 2.3 (L)   Phosphorus (mg/dL)  Date Value  01/21/2021 3.3   Sodium (mmol/L)  Date Value  01/21/2021 137  02/07/2013 141   Corrected Ca: 8.4  Assessment:  87 y.o. year old presented with sepsis, left ureteral stone, AKI requiring sent placement/drainage. Admitted to ICU for vasopressor support. Pharmacy has been consulted for monitoring and replacement of electrolytes as needed.  Goal of Therapy:  Electrolytes WNL  Plan:  K 3.3 - repleted with total of 63mq Kcl per tube Mag and Phos wnl's Repeat CMP with AM labs   CLu Duffel,PharmD, BCPS Clinical Pharmacist 01/21/2021 11:03 AM

## 2021-01-22 ENCOUNTER — Inpatient Hospital Stay: Payer: Medicare Other

## 2021-01-22 DIAGNOSIS — R7881 Bacteremia: Secondary | ICD-10-CM | POA: Diagnosis not present

## 2021-01-22 DIAGNOSIS — A419 Sepsis, unspecified organism: Secondary | ICD-10-CM | POA: Diagnosis not present

## 2021-01-22 DIAGNOSIS — B9620 Unspecified Escherichia coli [E. coli] as the cause of diseases classified elsewhere: Secondary | ICD-10-CM

## 2021-01-22 LAB — GLUCOSE, CAPILLARY
Glucose-Capillary: 86 mg/dL (ref 70–99)
Glucose-Capillary: 90 mg/dL (ref 70–99)
Glucose-Capillary: 92 mg/dL (ref 70–99)
Glucose-Capillary: 93 mg/dL (ref 70–99)
Glucose-Capillary: 95 mg/dL (ref 70–99)
Glucose-Capillary: 95 mg/dL (ref 70–99)
Glucose-Capillary: 99 mg/dL (ref 70–99)

## 2021-01-22 LAB — CBC WITH DIFFERENTIAL/PLATELET
Abs Immature Granulocytes: 2.45 10*3/uL — ABNORMAL HIGH (ref 0.00–0.07)
Basophils Absolute: 0.1 10*3/uL (ref 0.0–0.1)
Basophils Relative: 0 %
Eosinophils Absolute: 0 10*3/uL (ref 0.0–0.5)
Eosinophils Relative: 0 %
HCT: 31.4 % — ABNORMAL LOW (ref 36.0–46.0)
Hemoglobin: 10.7 g/dL — ABNORMAL LOW (ref 12.0–15.0)
Immature Granulocytes: 12 %
Lymphocytes Relative: 3 %
Lymphs Abs: 0.6 10*3/uL — ABNORMAL LOW (ref 0.7–4.0)
MCH: 30.9 pg (ref 26.0–34.0)
MCHC: 34.1 g/dL (ref 30.0–36.0)
MCV: 90.8 fL (ref 80.0–100.0)
Monocytes Absolute: 0.9 10*3/uL (ref 0.1–1.0)
Monocytes Relative: 4 %
Neutro Abs: 16.5 10*3/uL — ABNORMAL HIGH (ref 1.7–7.7)
Neutrophils Relative %: 81 %
Platelets: 62 10*3/uL — ABNORMAL LOW (ref 150–400)
RBC: 3.46 MIL/uL — ABNORMAL LOW (ref 3.87–5.11)
RDW: 13.6 % (ref 11.5–15.5)
Smear Review: NORMAL
WBC: 20.4 10*3/uL — ABNORMAL HIGH (ref 4.0–10.5)
nRBC: 0 % (ref 0.0–0.2)

## 2021-01-22 LAB — COMPREHENSIVE METABOLIC PANEL
ALT: 17 U/L (ref 0–44)
AST: 26 U/L (ref 15–41)
Albumin: 2.1 g/dL — ABNORMAL LOW (ref 3.5–5.0)
Alkaline Phosphatase: 66 U/L (ref 38–126)
Anion gap: 5 (ref 5–15)
BUN: 31 mg/dL — ABNORMAL HIGH (ref 8–23)
CO2: 27 mmol/L (ref 22–32)
Calcium: 7.1 mg/dL — ABNORMAL LOW (ref 8.9–10.3)
Chloride: 106 mmol/L (ref 98–111)
Creatinine, Ser: 0.85 mg/dL (ref 0.44–1.00)
GFR, Estimated: >60 mL/min (ref >60)
Glucose, Bld: 101 mg/dL — ABNORMAL HIGH (ref 70–99)
Potassium: 4.1 mmol/L (ref 3.5–5.1)
Sodium: 138 mmol/L (ref 135–145)
Total Bilirubin: 0.9 mg/dL (ref 0.3–1.2)
Total Protein: 4.1 g/dL — ABNORMAL LOW (ref 6.5–8.1)

## 2021-01-22 LAB — PHOSPHORUS: Phosphorus: 2.7 mg/dL (ref 2.5–4.6)

## 2021-01-22 LAB — PROCALCITONIN: Procalcitonin: 109.8 ng/mL

## 2021-01-22 LAB — MAGNESIUM: Magnesium: 2 mg/dL (ref 1.7–2.4)

## 2021-01-22 MED ORDER — SIMETHICONE 80 MG PO CHEW
80.0000 mg | CHEWABLE_TABLET | Freq: Four times a day (QID) | ORAL | Status: DC | PRN
  Administered 2021-01-22: 80 mg via ORAL
  Filled 2021-01-22 (×2): qty 1

## 2021-01-22 MED ORDER — BISACODYL 10 MG RE SUPP
10.0000 mg | Freq: Once | RECTAL | Status: AC
  Administered 2021-01-22: 10 mg via RECTAL
  Filled 2021-01-22: qty 1

## 2021-01-22 MED ORDER — ALPRAZOLAM 0.25 MG PO TABS
0.2500 mg | ORAL_TABLET | Freq: Two times a day (BID) | ORAL | Status: DC | PRN
  Administered 2021-01-22 – 2021-01-23 (×3): 0.25 mg via ORAL
  Filled 2021-01-22 (×3): qty 1

## 2021-01-22 MED ORDER — METOPROLOL TARTRATE 25 MG PO TABS
12.5000 mg | ORAL_TABLET | Freq: Every day | ORAL | Status: DC
  Administered 2021-01-22: 12.5 mg via ORAL
  Filled 2021-01-22: qty 1

## 2021-01-22 MED ORDER — METOPROLOL TARTRATE 5 MG/5ML IV SOLN
5.0000 mg | Freq: Once | INTRAVENOUS | Status: AC
  Administered 2021-01-22: 5 mg via INTRAVENOUS
  Filled 2021-01-22: qty 5

## 2021-01-22 MED ORDER — BISACODYL 10 MG RE SUPP: 10.0000 mg | Freq: Every day | RECTAL | Status: DC | PRN

## 2021-01-22 MED ORDER — METOPROLOL TARTRATE 5 MG/5ML IV SOLN
5.0000 mg | Freq: Four times a day (QID) | INTRAVENOUS | Status: DC | PRN
  Administered 2021-01-23 – 2021-01-25 (×6): 5 mg via INTRAVENOUS
  Filled 2021-01-22 (×5): qty 5

## 2021-01-22 NOTE — TOC Initial Note (Signed)
Transition of Care [facility]) - Initial/Assessment Note    Patient Details  Name: Lisa Gamble MRN: [id number] Date of Birth: 12-05-1925  Transition of Care [facility]) CM/SW Contact:    Ova Freshwater Phone Number: [phone] 01/22/2021, 8:46 AM  Clinical Narrative:                  Patient presents to [facility] from home due to confusion and fever of 103. Patient lives on her own and is independent with all ADLs.  Patient found to have severe sepsis due to infected left kidney stone, going emergently to the OR for stent placement by urology.  Patient is currently off pressors and on room air. Patient's main contact Reeg,Harold (Son) [phone] (Mobile)  Expected Discharge Plan: [place] Barriers to Discharge: Continued Medical Work up   Patient Goals and CMS Choice        Expected Discharge Plan and Services Expected Discharge Plan: [place] In-house Referral: Clinical Social Work   Post Acute Care Choice: [place] arrangements for the past 2 months: Apartment                                      Prior Living Arrangements/Services Living arrangements for the past 2 months: Apartment Lives with:: Self Patient language and need for interpreter reviewed:: Yes Do you feel safe going back to the place where you live?: Yes      Need for Family Participation in Patient Care: Yes (Comment) Care giver support system in place?: Yes (comment) Current home services: Other (comment) (Meal delivery) Criminal Activity/Legal Involvement Pertinent to Current Situation/Hospitalization: No - Comment as needed  Activities of Daily Living Home Assistive Devices/Equipment: None ADL Screening (condition at time of admission) Patient's cognitive ability adequate to safely complete daily activities?: Yes Is the patient deaf or have difficulty hearing?: No Does the patient have difficulty seeing, even when wearing  glasses/contacts?: No Does the patient have difficulty concentrating, remembering, or making decisions?: No Patient able to express need for assistance with ADLs?: Yes Does the patient have difficulty dressing or bathing?: No Independently performs ADLs?: Yes (appropriate for developmental age) Does the patient have difficulty walking or climbing stairs?: No Weakness of Legs: None Weakness of Arms/Hands: None  Permission Sought/Granted Permission sought to share information with : Family Supports ([place],Harold (Son)   [phone] (Mobile)) Permission granted to share information with : Yes, Verbal Permission Granted  Share Information with NAME: Nims,Harold (Son)   [phone] (Mobile)           Emotional Assessment Appearance:: Appears stated age Attitude/Demeanor/Rapport: Unable to Assess Affect (typically observed): Unable to Assess Orientation: : Fluctuating Orientation (Suspected and/or reported Sundowners) Alcohol / Substance Use: Not Applicable Psych Involvement: No (comment)  Admission diagnosis:  Kidney stone [N20.0] Sepsis ([place]) [A41.9] Altered mental status, unspecified altered mental status type [R41.82] Sepsis, due to unspecified organism, unspecified whether acute organ dysfunction present [facility]) [A41.9] Patient Active Problem List   Diagnosis Date Noted   Sepsis ([place]) 01/20/2021   PCP:  [name], MD Pharmacy:   [id number] [place] [id number]: [phone]: [phone]    Social Determinants of Health (SDOH) Interventions    Readmission Risk Interventions No flowsheet data found.

## 2021-01-22 NOTE — Progress Notes (Signed)
NAME:  Lisa Gamble, MRN:  [id number], DOB:  07/22/1925, LOS: 2 ADMISSION DATE:  01/20/2021, CONSULTATION DATE:  01/20/2021 REFERRING MD:  Dr. Jari Pigg, CHIEF COMPLAINT:  Altered Mental Status   Brief Pt Description / Synopsis:  87 year old female admitted with septic shock in the setting of  urosepsis due to left ureteropelvic junction stone with hydronephrosis.  To go to the OR for emergent cystoscopy and left ureteral stent placement by Urology.  History of Present Illness:  Lisa Gamble is a 87 year old female with a past medical history as listed below who presented to [facility] ED on 01/20/2021 due to altered mental status and fever of 103.  Patient currently with altered mental status, therefore history is obtained from ED and nursing notes.  Per notes she did not answer her phone today from a mail delivery service which family was notified.  When family arrived they found her to be confused and not answering questions appropriately, oriented x1.  She met sepsis criteria due to tachycardia, hypotension, and fever.  She received IV fluids and broad-spectrum antibiotics.  Urology was consulted and plans to proceed with emergent cystoscopy and left ureteral stent placement.  PCCM is asked to admit the patient.  She returns to the ICU post procedure and remains intubated with severe septic shock requiring vasopressors.  Pertinent  Medical History  Asthma Anxiety Arthritis Hard of hearing  Micro Data:  01/20/2021: SARS-CoV-2 and influenza PCR>> negative 01/20/2021: Blood culture x2>> 01/20/2021: Urine>>  Antimicrobials:  Cefepime 8/26>> Flagyl 8/26>> Vancomycin 8/26>>  Significant Hospital Events: Including procedures, antibiotic start and stop dates in addition to other pertinent events   01/20/2021: Presents to ED, found to have severe sepsis due to infected left kidney stone, going emergently to the OR for stent placement by urology 8/27 remains on pressors, severe acidosis E  coli bacteremia 8/28 off pressors  Interim History / Subjective:  Shock improving On Rocephin E coli Bacteremia Elevated troponins +ischemic cardiomyopathy   Objective   Blood pressure 133/82, pulse (!) 102, temperature 97.9 F (36.6 C), temperature source Axillary, resp. rate 17, height '5\' 3"'  (1.6 m), weight 61.3 kg, SpO2 100 %. CVP:  [5 mmHg-12 mmHg] 9 mmHg  Vent Mode: PSV FiO2 (%):  [28 %-35 %] 28 % PEEP:  [5 cmH20] 5 cmH20 Pressure Support:  [5 cmH20] 5 cmH20   Intake/Output Summary (Last 24 hours) at 01/22/2021 0751 Last data filed at 01/22/2021 2952 Gross per 24 hour  Intake 5462.49 ml  Output 1670 ml  Net 3792.49 ml    Filed Weights   01/20/21 1653 01/21/21 0318 01/22/21 0500  Weight: 52.3 kg 56.2 kg 61.3 kg    REVIEW OF SYSTEMS LIMITED DUE TO LETHARGY  Physical Examination:   General Appearance: No distress  EYES PERRLA, EOM intact.   NECK Supple, No JVD Pulmonary: normal breath sounds, No wheezing.  CardiovascularNormal S1,S2.  No m/r/g.   Abdomen: Benign, Soft, non-tender. PSYCHIATRIC: poor insight     Assessment & Plan:  87 yo thin frail white female with severe septic shock and acidosis with metabolic encephalopathy with acute left kidney infection with infected kidney stone  SEPTIC SHOCK RESOLVING RESP FAILURE RESOLVING   INFECTIOUS DISEASE Severe Urosepsis due to to Left Ureteropelvic Junction Stone with Hydronephrosis  -continue antibiotics as prescribed -follow up cultures Follow up Urology recs   NEUROLOGY ACUTE TOXIC METABOLIC ENCEPHALOPATHY Due to sepsis   Questionable Right Ovarian Neoplasm -Once stabilized from severe Urosepsis, will need Ultrasound and further workup  Best Practice (right click and "Reselect all SmartList Selections" daily)   Diet/type: NPO DVT prophylaxis: SCD (once cleared by Urology, can start chemical prophylaxis following surgery) GI prophylaxis: PPI Lines: central line placed Foley:  yes, and  is still needed Code Status:  DNR/DNI   Labs   CBC: Recent Labs  Lab 01/20/21 1142 01/21/21 0417 01/22/21 0615  WBC 10.0 47.1* 20.4*  NEUTROABS 9.3* 42.2* 16.5*  HGB 13.1 11.8* 10.7*  HCT 39.9 35.3* 31.4*  MCV 94.1 93.6 90.8  PLT 165 168 62*     Basic Metabolic Panel: Recent Labs  Lab 01/20/21 1142 01/20/21 1809 01/20/21 1900 01/21/21 0417 01/22/21 0615  NA 140  --   --  137 138  K 3.6  --  3.1* 3.3* 4.1  CL 103  --   --  109 106  CO2 19*  --   --  21* 27  GLUCOSE 158*  --   --  179* 101*  BUN 38*  --   --  31* 31*  CREATININE 1.44*  --   --  0.98 0.85  CALCIUM 7.7*  --  6.5* 7.0* 7.1*  MG  --   --  1.4* 2.4 2.0  PHOS  --  3.4  --  3.3 2.7    GFR: Estimated Creatinine Clearance: 34.2 mL/min (by C-G formula based on SCr of 0.85 mg/dL). Recent Labs  Lab 01/20/21 1142 01/20/21 1340 01/20/21 1940 01/20/21 2057 01/21/21 0417 01/21/21 1156 01/22/21 0615  PROCALCITON 58.25  --   --   --  >150.00  --  109.80  WBC 10.0  --   --   --  47.1*  --  20.4*  LATICACIDVEN 10.9*   < > 5.8* 6.5* 5.6* 5.1*  --    < > = values in this interval not displayed.     Liver Function Tests: Recent Labs  Lab 01/20/21 1142 01/21/21 0417 01/22/21 0615  AST 45* 35 26  ALT '18 17 17  ' ALKPHOS 60 45 66  BILITOT 1.4* 0.9 0.9  PROT 5.3* 4.1* 4.1*  ALBUMIN 3.1* 2.3* 2.1*    No results for input(s): LIPASE, AMYLASE in the last 168 hours. No results for input(s): AMMONIA in the last 168 hours.  ABG    Component Value Date/Time   PHART 7.33 (L) 01/21/2021 0442   PCO2ART 32 01/21/2021 0442   PO2ART 129 (H) 01/21/2021 0442   HCO3 16.9 (L) 01/21/2021 0442   ACIDBASEDEF 8.0 (H) 01/21/2021 0442   O2SAT 98.7 01/21/2021 0442     Coagulation Profile: Recent Labs  Lab 01/20/21 1142  INR 1.3*      Recent Labs  Lab 01/21/21 2047 01/21/21 2233 01/22/21 0010 01/22/21 0323 01/22/21 0744  GLUCAP 68* 94 86 95 95    Transfer to [facility] service 8/29  Corrin Parker, M.D.   [facility]  Medical Director [place] Director [facility] Cardio-Pulmonary Department

## 2021-01-22 NOTE — Progress Notes (Signed)
Pt alert, oriented to self only. Very forgetful. Pt grips and moves symmetrically. Passed [facility] swallow screen. Complains of lower abdominal pain and has had several events of non-sustained A-fib RVR rates up to 160s with these complaints of pain. MD made aware. New order for dulcolax suppository. Tylenol also administered. Will continue to monitor.

## 2021-01-22 NOTE — Consult Note (Signed)
PHARMACY CONSULT NOTE - FOLLOW UP  Pharmacy Consult for Electrolyte Monitoring and Replacement   Recent Labs: Potassium (mmol/L)  Date Value  01/22/2021 4.1  02/07/2013 3.7   Magnesium (mg/dL)  Date Value  01/22/2021 2.0   Calcium (mg/dL)  Date Value  01/22/2021 7.1 (L)   Calcium, Total (mg/dL)  Date Value  02/07/2013 8.5   Albumin (g/dL)  Date Value  01/22/2021 2.1 (L)   Phosphorus (mg/dL)  Date Value  01/22/2021 2.7   Sodium (mmol/L)  Date Value  01/22/2021 138  02/07/2013 141   Corrected Ca: 8.4  Assessment:  87 y.o. year old presented with sepsis, left ureteral stone, AKI requiring sent placement/drainage. Admitted to ICU for vasopressor support. Pharmacy has been consulted for monitoring and replacement of electrolytes as needed.  Goal of Therapy:  Electrolytes WNL  Plan:  K 4.1 - no replenishment warranted Mag and Phos wnl's Repeat CMP with AM labs as ordered by provider Continue to monitor and replete as needed  Lu Duffel ,PharmD, BCPS Clinical Pharmacist 01/22/2021 7:50 AM

## 2021-01-23 ENCOUNTER — Encounter: Payer: Self-pay | Admitting: Urology

## 2021-01-23 DIAGNOSIS — N179 Acute kidney failure, unspecified: Secondary | ICD-10-CM

## 2021-01-23 DIAGNOSIS — E872 Acidosis: Secondary | ICD-10-CM

## 2021-01-23 DIAGNOSIS — I471 Supraventricular tachycardia: Secondary | ICD-10-CM

## 2021-01-23 DIAGNOSIS — N201 Calculus of ureter: Secondary | ICD-10-CM

## 2021-01-23 DIAGNOSIS — R739 Hyperglycemia, unspecified: Secondary | ICD-10-CM

## 2021-01-23 DIAGNOSIS — Z978 Presence of other specified devices: Secondary | ICD-10-CM

## 2021-01-23 DIAGNOSIS — G9341 Metabolic encephalopathy: Secondary | ICD-10-CM

## 2021-01-23 DIAGNOSIS — E8729 Other acidosis: Secondary | ICD-10-CM

## 2021-01-23 DIAGNOSIS — J95821 Acute postprocedural respiratory failure: Secondary | ICD-10-CM

## 2021-01-23 LAB — CBC WITH DIFFERENTIAL/PLATELET
Abs Immature Granulocytes: 0.15 10*3/uL — ABNORMAL HIGH (ref 0.00–0.07)
Basophils Absolute: 0.1 10*3/uL (ref 0.0–0.1)
Basophils Relative: 0 %
Eosinophils Absolute: 0 10*3/uL (ref 0.0–0.5)
Eosinophils Relative: 0 %
HCT: 32 % — ABNORMAL LOW (ref 36.0–46.0)
Hemoglobin: 10.7 g/dL — ABNORMAL LOW (ref 12.0–15.0)
Immature Granulocytes: 1 %
Lymphocytes Relative: 4 %
Lymphs Abs: 0.9 10*3/uL (ref 0.7–4.0)
MCH: 30.6 pg (ref 26.0–34.0)
MCHC: 33.4 g/dL (ref 30.0–36.0)
MCV: 91.4 fL (ref 80.0–100.0)
Monocytes Absolute: 0.6 10*3/uL (ref 0.1–1.0)
Monocytes Relative: 2 %
Neutro Abs: 22.1 10*3/uL — ABNORMAL HIGH (ref 1.7–7.7)
Neutrophils Relative %: 93 %
Platelets: 53 10*3/uL — ABNORMAL LOW (ref 150–400)
RBC: 3.5 MIL/uL — ABNORMAL LOW (ref 3.87–5.11)
RDW: 13.8 % (ref 11.5–15.5)
Smear Review: NORMAL
WBC: 23.8 10*3/uL — ABNORMAL HIGH (ref 4.0–10.5)
nRBC: 0 % (ref 0.0–0.2)

## 2021-01-23 LAB — COMPREHENSIVE METABOLIC PANEL
ALT: 16 U/L (ref 0–44)
AST: 23 U/L (ref 15–41)
Albumin: 2.3 g/dL — ABNORMAL LOW (ref 3.5–5.0)
Alkaline Phosphatase: 74 U/L (ref 38–126)
Anion gap: 7 (ref 5–15)
BUN: 23 mg/dL (ref 8–23)
CO2: 28 mmol/L (ref 22–32)
Calcium: 6.9 mg/dL — ABNORMAL LOW (ref 8.9–10.3)
Chloride: 102 mmol/L (ref 98–111)
Creatinine, Ser: 0.83 mg/dL (ref 0.44–1.00)
GFR, Estimated: >60 mL/min (ref >60)
Glucose, Bld: 78 mg/dL (ref 70–99)
Potassium: 3 mmol/L — ABNORMAL LOW (ref 3.5–5.1)
Sodium: 137 mmol/L (ref 135–145)
Total Bilirubin: 1.1 mg/dL (ref 0.3–1.2)
Total Protein: 4.2 g/dL — ABNORMAL LOW (ref 6.5–8.1)

## 2021-01-23 LAB — CULTURE, BLOOD (ROUTINE X 2)

## 2021-01-23 LAB — URINE CULTURE: Culture: >=100000 — AB

## 2021-01-23 LAB — MAGNESIUM: Magnesium: 1.9 mg/dL (ref 1.7–2.4)

## 2021-01-23 LAB — PHOSPHORUS: Phosphorus: 2.4 mg/dL — ABNORMAL LOW (ref 2.5–4.6)

## 2021-01-23 LAB — GLUCOSE, CAPILLARY
Glucose-Capillary: 79 mg/dL (ref 70–99)
Glucose-Capillary: 77 mg/dL (ref 70–99)

## 2021-01-23 MED ORDER — HALOPERIDOL 1 MG PO TABS
1.0000 mg | ORAL_TABLET | Freq: Once | ORAL | Status: DC
  Filled 2021-01-23: qty 1

## 2021-01-23 MED ORDER — LACTATED RINGERS IV SOLN: INTRAVENOUS | Status: DC

## 2021-01-23 MED ORDER — CEFAZOLIN SODIUM-DEXTROSE 2-4 GM/100ML-% IV SOLN
2.0000 g | Freq: Two times a day (BID) | INTRAVENOUS | Status: DC
  Administered 2021-01-23 – 2021-01-24 (×4): 2 g via INTRAVENOUS
  Filled 2021-01-23 (×8): qty 100

## 2021-01-23 MED ORDER — ENSURE ENLIVE PO LIQD
237.0000 mL | Freq: Two times a day (BID) | ORAL | Status: DC
  Administered 2021-01-23 – 2021-01-27 (×8): 237 mL via ORAL

## 2021-01-23 MED ORDER — HALOPERIDOL LACTATE 5 MG/ML IJ SOLN
1.0000 mg | Freq: Once | INTRAMUSCULAR | Status: AC
  Administered 2021-01-23: 1 mg via INTRAVENOUS
  Filled 2021-01-23: qty 1

## 2021-01-23 MED ORDER — POTASSIUM CHLORIDE CRYS ER 20 MEQ PO TBCR
20.0000 meq | EXTENDED_RELEASE_TABLET | ORAL | Status: AC
  Administered 2021-01-23 (×2): 20 meq via ORAL
  Filled 2021-01-23: qty 1

## 2021-01-23 MED ORDER — METOPROLOL TARTRATE 25 MG PO TABS: 12.5000 mg | ORAL_TABLET | Freq: Two times a day (BID) | ORAL | Status: DC

## 2021-01-23 MED ORDER — POTASSIUM CHLORIDE 10 MEQ/50ML IV SOLN
10.0000 meq | INTRAVENOUS | Status: AC
  Administered 2021-01-23 (×4): 10 meq via INTRAVENOUS
  Filled 2021-01-23 (×4): qty 50

## 2021-01-23 MED ORDER — METOPROLOL TARTRATE 25 MG PO TABS
12.5000 mg | ORAL_TABLET | Freq: Four times a day (QID) | ORAL | Status: DC
  Administered 2021-01-23 – 2021-01-27 (×12): 12.5 mg via ORAL
  Filled 2021-01-23 (×12): qty 1

## 2021-01-23 NOTE — Consult Note (Signed)
[facility] for Electrolyte Monitoring and Replacement   Recent Labs: Potassium (mmol/L)  Date Value  01/23/2021 3.0 (L)  02/07/2013 3.7   Magnesium (mg/dL)  Date Value  01/23/2021 1.9   Calcium (mg/dL)  Date Value  01/23/2021 6.9 (L)   Calcium, Total (mg/dL)  Date Value  02/07/2013 8.5   Albumin (g/dL)  Date Value  01/23/2021 2.3 (L)   Phosphorus (mg/dL)  Date Value  01/23/2021 2.4 (L)   Sodium (mmol/L)  Date Value  01/23/2021 137  02/07/2013 141   Corrected Ca: 8.43 mg/dL  Assessment:  87 y.o. year old presented with sepsis, left ureteral stone, AKI requiring sent placement/drainage. Admitted to ICU for vasopressor support. Pharmacy has been consulted for monitoring and replacement of electrolytes as needed.  MIVF: LR at 50 mL/hr  Goal of Therapy:  Electrolytes WNL  Plan:  20 mEq or KCl x 2 per NP 10 mEq IV KCl x 4 per NP Continue to monitor and replete as needed  Dallie Piles ,PharmD, BCPS Clinical Pharmacist 01/23/2021 7:20 AM

## 2021-01-23 NOTE — Progress Notes (Signed)
PROGRESS NOTE    Lisa Gamble  JSH:[id number] DOB: September 25, 1925 DOA: 01/20/2021 PCP: [name], MD   Chief complaint.  Altered mental status. Brief Narrative:  Lisa Gamble is a 87 year old female with past medical history of only asthma, anxiety and arthritis who present to the emergency room fever, altered mental status.  She also had a significant leukocytosis, tachycardia.  She developed hypotension, with a lactic acid level 10.9.  She is diagnosed with septic shock due to urinary tract infection.  CT scan showed left-sided 8 mm ureteral stone with hydronephrosis.  He received IV fluids, broad-spectrum antibiotics.  She had a ureter stent placed on 8/26.  Postoperatively, patient developed respiratory failure requiring mechanical ventilation. Patient blood culture has grew E. coli, urine culture also positive E. coli. Patient has been extubated, transferred to hospitalist service on 8/28.   Assessment & Plan:   Active Problems:   Septic shock (HCC)   SVT (supraventricular tachycardia) (HCC)   AKI (acute kidney injury) ([facility])   Increased anion gap metabolic acidosis   Postoperative respiratory failure (HCC)   Endotracheally intubated   Hyperglycemia   Acute metabolic encephalopathy   Obstruction of left ureteropelvic junction (UPJ) due to stone  #1.  Severe sepsis with septic shock. E. coli septicemia. E. coli pyelonephritis. Left ureteral stone with hydronephrosis. Acute hypoxemic respite failure secondary to septic shock. Patient condition had improved so far, currently she is treated with Rocephin.  Susceptibility from urine cultures available, blood culture susceptibilities still pending. Probably can be treated with Cipro for 2 weeks at time of discharge. Patient respiratory status has improved.  2.  Acute kidney injury secondary to septic shock and obstruction. Anion gap metabolic acidosis with severe lactic acidosis. Hypokalemia. Renal function has  normalized, metabolic acidosis resolved.   Night team has ordered potassium repletion. Patient currently still confused, adequate p.o. intake, I will keep maintenance fluids with lactated Ringer at 50 mL/h.  #3.  Acute metabolic encephalopathy. Dementia. Patient still has some confusion, suspect patient has baseline dementia. Spoke with speech therapist, diet is started.  4.  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 Outpatient work-up.  #5.  Severe thrombocytopenia. Likely secondary to septic shock. Reviewed previous medication orders, patient never received heparin.   DVT prophylaxis: SCDs Code Status: DNR Family Communication:  son updated Disposition Plan:   Status is: Inpatient  Remains inpatient appropriate because:Altered mental status, IV treatments appropriate due to intensity of illness or inability to take PO, and Inpatient level of care appropriate due to severity of illness  Dispo: The patient is from: Home              Anticipated d/c is to: SNF              Patient currently is not medically stable to d/c.   Difficult to place patient No        I/O last 3 completed shifts: In: 2860.1 [P.O.:120; I.V.:2340.1; IV Piggyback:400] Out: 3025 [Urine:3025] No intake/output data recorded.     Consultants:  Nephrology  Procedures: Ureteral stent.  Antimicrobials: Rocephin. Subjective: Patient is very confused, no agitation. Denies any short of breath or cough. No fever or chills.  No abdominal pain or nausea vomiting. No dysuria hematuria    Objective: Vitals:   01/23/21 0200 01/23/21 0300 01/23/21 0400 01/23/21 0500  BP: (!) 147/79 140/70 (!) 150/81   Pulse: 97 90 98   Resp: 18 (!) 25 20   Temp:   99 F (37.2 C)   TempSrc:  Axillary   SpO2: 98% 97% 96%   Weight:    59.9 kg  Height:        Intake/Output Summary (Last 24 hours) at 01/23/2021 1038 Last data filed at 01/23/2021 0500 Gross per 24 hour  Intake 882.93 ml  Output 2175 ml  Net -1292.07  ml   Filed Weights   01/21/21 0318 01/22/21 0500 01/23/21 0500  Weight: 56.2 kg 61.3 kg 59.9 kg    Examination:  General exam: Appears calm and comfortable  Respiratory system: Clear to auscultation. Respiratory effort normal. Cardiovascular system: S1 & S2 heard, RRR. No JVD, murmurs, rubs, gallops or clicks. No pedal edema. Gastrointestinal system: Abdomen is nondistended, soft and nontender. No organomegaly or masses felt. Normal bowel sounds heard. Central nervous system: Alert and oriented x1.  No focal neurological deficits. Extremities: Symmetric 5 x 5 power. Skin: No rashes, lesions or ulcers     Data Reviewed: I have personally reviewed following labs and imaging studies  CBC: Recent Labs  Lab 01/20/21 1142 01/21/21 0417 01/22/21 0615 01/23/21 0459  WBC 10.0 47.1* 20.4* 23.8*  NEUTROABS 9.3* 42.2* 16.5* 22.1*  HGB 13.1 11.8* 10.7* 10.7*  HCT 39.9 35.3* 31.4* 32.0*  MCV 94.1 93.6 90.8 91.4  PLT 165 168 62* 53*   Basic Metabolic Panel: Recent Labs  Lab 01/20/21 1142 01/20/21 1809 01/20/21 1900 01/21/21 0417 01/22/21 0615 01/23/21 0459  NA 140  --   --  137 138 137  K 3.6  --  3.1* 3.3* 4.1 3.0*  CL 103  --   --  109 106 102  CO2 19*  --   --  21* 27 28  GLUCOSE 158*  --   --  179* 101* 78  BUN 38*  --   --  31* 31* 23  CREATININE 1.44*  --   --  0.98 0.85 0.83  CALCIUM 7.7*  --  6.5* 7.0* 7.1* 6.9*  MG  --   --  1.4* 2.4 2.0 1.9  PHOS  --  3.4  --  3.3 2.7 2.4*   GFR: Estimated Creatinine Clearance: 35 mL/min (by C-G formula based on SCr of 0.83 mg/dL). Liver Function Tests: Recent Labs  Lab 01/20/21 1142 01/21/21 0417 01/22/21 0615 01/23/21 0459  AST 45* 35 26 23  ALT '18 17 17 16  ' ALKPHOS 60 45 66 74  BILITOT 1.4* 0.9 0.9 1.1  PROT 5.3* 4.1* 4.1* 4.2*  ALBUMIN 3.1* 2.3* 2.1* 2.3*   No results for input(s): LIPASE, AMYLASE in the last 168 hours. No results for input(s): AMMONIA in the last 168 hours. Coagulation Profile: Recent Labs   Lab 01/20/21 1142  INR 1.3*   Cardiac Enzymes: No results for input(s): CKTOTAL, CKMB, CKMBINDEX, TROPONINI in the last 168 hours. BNP (last 3 results) No results for input(s): PROBNP in the last 8760 hours. HbA1C: Recent Labs    01/21/21 0417  HGBA1C 5.6   CBG: Recent Labs  Lab 01/22/21 1606 01/22/21 1932 01/22/21 2339 01/23/21 0458 01/23/21 0803  GLUCAP 90 93 99 79 77   Lipid Profile: No results for input(s): CHOL, HDL, LDLCALC, TRIG, CHOLHDL, LDLDIRECT in the last 72 hours. Thyroid Function Tests: No results for input(s): TSH, T4TOTAL, FREET4, T3FREE, THYROIDAB in the last 72 hours. Anemia Panel: No results for input(s): VITAMINB12, FOLATE, FERRITIN, TIBC, IRON, RETICCTPCT in the last 72 hours. Sepsis Labs: Recent Labs  Lab 01/20/21 1142 01/20/21 1340 01/20/21 1940 01/20/21 2057 01/21/21 0417 01/21/21 1156 01/22/21 0615  PROCALCITON 58.25  --   --   --  >  150.00  --  109.80  LATICACIDVEN 10.9*   < > 5.8* 6.5* 5.6* 5.1*  --    < > = values in this interval not displayed.    Recent Results (from the past 240 hour(s))  Culture, blood (Routine x 2)     Status: None (Preliminary result)   Collection Time: 01/20/21 11:42 AM   Specimen: BLOOD  Result Value Ref Range Status   Specimen Description BLOOD BLOOD RIGHT FOREARM  Final   Special Requests   Final    BOTTLES DRAWN AEROBIC AND ANAEROBIC Blood Culture results may not be optimal due to an inadequate volume of blood received in culture bottles   Culture   Final    NO GROWTH 3 DAYS Performed at [facility], [address], [place], [place] [address]    Report Status PENDING  Incomplete  Culture, blood (Routine x 2)     Status: Abnormal   Collection Time: 01/20/21 11:42 AM   Specimen: BLOOD  Result Value Ref Range Status   Specimen Description   Final    BLOOD LEFT ANTECUBITAL Performed at [facility], [address], [place], [place] [address]    Special Requests   Final     BOTTLES DRAWN AEROBIC AND ANAEROBIC Blood Culture results may not be optimal due to an inadequate volume of blood received in culture bottles Performed at [facility] [place], [address] [place], [place] [address]    Culture  Setup Time   Final    GRAM NEGATIVE RODS ANAEROBIC BOTTLE ONLY CRITICAL RESULT CALLED TO, READ BACK BY AND VERIFIED WITH: CAROLINE CHILDS AT 1129 01/21/21.PMF Performed at [facility], [place] [address] [place], [place] [address]    Culture ESCHERICHIA COLI (A)  Final   Report Status 01/23/2021 FINAL  Final   Organism ID, Bacteria ESCHERICHIA COLI  Final      Susceptibility   Escherichia coli - MIC*    AMPICILLIN 8 SENSITIVE Sensitive     CEFAZOLIN <=4 SENSITIVE Sensitive     CEFEPIME <=0.12 SENSITIVE Sensitive     CEFTAZIDIME <=1 SENSITIVE Sensitive     CEFTRIAXONE <=0.25 SENSITIVE Sensitive     CIPROFLOXACIN <=0.25 SENSITIVE Sensitive     GENTAMICIN <=1 SENSITIVE Sensitive     IMIPENEM 1 SENSITIVE Sensitive     TRIMETH/SULFA <=20 SENSITIVE Sensitive     AMPICILLIN/SULBACTAM <=2 SENSITIVE Sensitive     PIP/TAZO <=4 SENSITIVE Sensitive     * ESCHERICHIA COLI  Urine Culture     Status: Abnormal   Collection Time: 01/20/21 11:42 AM   Specimen: Urine, Clean Catch  Result Value Ref Range Status   Specimen Description   Final    URINE, CLEAN CATCH Performed at [facility], [address], [place], [place] [address]    Special Requests   Final    NONE Performed at [facility], [address] [place],  [address]    Culture >=100,000 COLONIES/mL ESCHERICHIA COLI (A)  Final   Report Status 01/23/2021 FINAL  Final   Organism ID, Bacteria ESCHERICHIA COLI (A)  Final      Susceptibility   Escherichia coli - MIC*    AMPICILLIN <=2 SENSITIVE Sensitive     CEFAZOLIN <=4 SENSITIVE Sensitive     CEFEPIME <=0.12 SENSITIVE Sensitive     CEFTRIAXONE <=0.25 SENSITIVE Sensitive     CIPROFLOXACIN <=0.25 SENSITIVE Sensitive      GENTAMICIN <=1 SENSITIVE Sensitive     IMIPENEM <=0.25 SENSITIVE  Sensitive     NITROFURANTOIN <=16 SENSITIVE Sensitive     TRIMETH/SULFA <=20 SENSITIVE Sensitive     AMPICILLIN/SULBACTAM <=2 SENSITIVE Sensitive     PIP/TAZO <=4 SENSITIVE Sensitive     * >=100,000 COLONIES/mL ESCHERICHIA COLI  Blood Culture ID Panel (Reflexed)     Status: Abnormal   Collection Time: 01/20/21 11:42 AM  Result Value Ref Range Status   Enterococcus faecalis NOT DETECTED NOT DETECTED Final   Enterococcus Faecium NOT DETECTED NOT DETECTED Final   Listeria monocytogenes NOT DETECTED NOT DETECTED Final   Staphylococcus species NOT DETECTED NOT DETECTED Final   Staphylococcus aureus (BCID) NOT DETECTED NOT DETECTED Final   Staphylococcus epidermidis NOT DETECTED NOT DETECTED Final   Staphylococcus lugdunensis NOT DETECTED NOT DETECTED Final   Streptococcus species NOT DETECTED NOT DETECTED Final   Streptococcus agalactiae NOT DETECTED NOT DETECTED Final   Streptococcus pneumoniae NOT DETECTED NOT DETECTED Final   Streptococcus pyogenes NOT DETECTED NOT DETECTED Final   A.calcoaceticus-baumannii NOT DETECTED NOT DETECTED Final   Bacteroides fragilis NOT DETECTED NOT DETECTED Final   Enterobacterales DETECTED (A) NOT DETECTED Final    Comment: Enterobacterales represent a large order of gram negative bacteria, not a single organism. CRITICAL RESULT CALLED TO, READ BACK BY AND VERIFIED WITH: CAROLINE CHILDS AT 1129 01/21/21.PMF    Enterobacter cloacae complex NOT DETECTED NOT DETECTED Final   Escherichia coli DETECTED (A) NOT DETECTED Final    Comment: CRITICAL RESULT CALLED TO, READ BACK BY AND VERIFIED WITH: CAROLINE CHILDS AT 1129 01/21/21.PMF    Klebsiella aerogenes NOT DETECTED NOT DETECTED Final   Klebsiella oxytoca NOT DETECTED NOT DETECTED Final   Klebsiella pneumoniae NOT DETECTED NOT DETECTED Final   Proteus species NOT DETECTED NOT DETECTED Final   Salmonella species NOT DETECTED NOT DETECTED Final    Serratia marcescens NOT DETECTED NOT DETECTED Final   Haemophilus influenzae NOT DETECTED NOT DETECTED Final   Neisseria meningitidis NOT DETECTED NOT DETECTED Final   Pseudomonas aeruginosa NOT DETECTED NOT DETECTED Final   Stenotrophomonas maltophilia NOT DETECTED NOT DETECTED Final   Candida albicans NOT DETECTED NOT DETECTED Final   Candida auris NOT DETECTED NOT DETECTED Final   Candida glabrata NOT DETECTED NOT DETECTED Final   Candida krusei NOT DETECTED NOT DETECTED Final   Candida parapsilosis NOT DETECTED NOT DETECTED Final   Candida tropicalis NOT DETECTED NOT DETECTED Final   Cryptococcus neoformans/gattii NOT DETECTED NOT DETECTED Final   CTX-M ESBL NOT DETECTED NOT DETECTED Final   Carbapenem resistance IMP NOT DETECTED NOT DETECTED Final   Carbapenem resistance KPC NOT DETECTED NOT DETECTED Final   Carbapenem resistance NDM NOT DETECTED NOT DETECTED Final   Carbapenem resist OXA 48 LIKE NOT DETECTED NOT DETECTED Final   Carbapenem resistance VIM NOT DETECTED NOT DETECTED Final    Comment: Performed at [facility], [address] [place], [place] [address]  Resp Panel by RT-PCR (Flu A&B, Covid) Nasopharyngeal Swab     Status: None   Collection Time: 01/20/21 12:21 PM   Specimen: Nasopharyngeal Swab; Nasopharyngeal(NP) swabs in vial transport medium  Result Value Ref Range Status   SARS Coronavirus 2 by RT PCR NEGATIVE NEGATIVE Final    Comment: (NOTE) SARS-CoV-2 target nucleic acids are NOT DETECTED.  The SARS-CoV-2 RNA is generally detectable in upper respiratory specimens during the acute phase of infection. The lowest concentration of SARS-CoV-2 viral copies this assay can detect is 138 copies/mL. A negative result does not preclude SARS-Cov-2 infection and should not  be used as the sole basis for treatment or other patient management decisions. A negative result may occur with  improper specimen collection/handling, submission of specimen other than  nasopharyngeal swab, presence of viral mutation(s) within the areas targeted by this assay, and inadequate number of viral copies(<138 copies/mL). A negative result must be combined with clinical observations, patient history, and epidemiological information. The expected result is Negative.  Fact Sheet for Patients:  EntrepreneurPulse.com.au  Fact Sheet for Healthcare Providers:  IncredibleEmployment.be  This test is no t yet approved or cleared by the [place] FDA and  has been authorized for detection and/or diagnosis of SARS-CoV-2 by FDA under an Emergency Use Authorization (EUA). This EUA will remain  in effect (meaning this test can be used) for the duration of the COVID-19 declaration under Section 564(b)(1) of the Act, 21 U.S.C.section 360bbb-3(b)(1), unless the authorization is terminated  or revoked sooner.       Influenza A by PCR NEGATIVE NEGATIVE Final   Influenza B by PCR NEGATIVE NEGATIVE Final    Comment: (NOTE) The Xpert Xpress SARS-CoV-2/FLU/RSV plus assay is intended as an aid in the diagnosis of influenza from Nasopharyngeal swab specimens and should not be used as a sole basis for treatment. Nasal washings and aspirates are unacceptable for Xpert Xpress SARS-CoV-2/FLU/RSV testing.  Fact Sheet for Patients: EntrepreneurPulse.com.au  Fact Sheet for Healthcare Providers: IncredibleEmployment.be  This test is not yet approved or cleared by the [place] FDA and has been authorized for detection and/or diagnosis of SARS-CoV-2 by FDA under an Emergency Use Authorization (EUA). This EUA will remain in effect (meaning this test can be used) for the duration of the COVID-19 declaration under Section 564(b)(1) of the Act, 21 U.S.C. section 360bbb-3(b)(1), unless the authorization is terminated or revoked.  Performed at [facility], [address] [place], [place]  [address]   MRSA Next Gen by PCR, Nasal     Status: None   Collection Time: 01/20/21  5:03 PM   Specimen: Nasal Mucosa; Nasal Swab  Result Value Ref Range Status   MRSA by PCR Next Gen NOT DETECTED NOT DETECTED Final    Comment: (NOTE) The GeneXpert MRSA Assay (FDA approved for NASAL specimens only), is one component of a comprehensive MRSA colonization surveillance program. It is not intended to diagnose MRSA infection nor to guide or monitor treatment for MRSA infections. Test performance is not FDA approved in patients less than 76 years old. Performed at [facility], [address] [place], [place] [address]          Radiology Studies: DG Abd 1 View  Result Date: 01/22/2021 CLINICAL DATA:  Abdominal pain EXAM: ABDOMEN - 1 VIEW COMPARISON:  01/20/2021 FINDINGS: Left double-pigtail ureteral stent. Nonobstructive bowel gas pattern. Degenerative changes of the lumbar spine.  Vascular calcifications. Left hip arthroplasty. IMPRESSION: Left double-pigtail ureteral stent. Otherwise unremarkable. Electronically Signed   By: Julian Hy M.D.   On: 01/22/2021 02:57   DG Chest Port 1 View  Result Date: 01/22/2021 CLINICAL DATA:  Shortness of breath EXAM: PORTABLE CHEST 1 VIEW COMPARISON:  January 20, 2021 FINDINGS: Left central line is stable. The ETT is been removed. No pneumothorax. Bilateral layering effusions with underlying atelectasis or not seen previously. The NG tube is been removed in the interval. The cardiomediastinal silhouette is stable. No other changes. IMPRESSION: 1. Support apparatus as above. 2. Bilateral layering pleural effusions with underlying opacities, likely atelectasis, not seen previously. 3. No other acute abnormalities. Electronically Signed  By: Dorise Bullion III M.D.   On: 01/22/2021 20:44        Scheduled Meds:  chlorhexidine gluconate (MEDLINE KIT)  15 mL Mouth Rinse BID   Chlorhexidine Gluconate Cloth  6 each Topical Q0600   insulin  aspart  0-15 Units Subcutaneous Q4H   metoprolol tartrate  12.5 mg Oral Q6H   potassium chloride  20 mEq Oral Q4H   Continuous Infusions:  sodium chloride 10 mL/hr at 01/22/21 1743   cefTRIAXone (ROCEPHIN)  IV Stopped (01/22/21 1237)   potassium chloride 10 mEq (01/23/21 0927)    sodium bicarbonate (isotonic) infusion in sterile water 75 mL/hr at 01/22/21 1743     LOS: 3 days    Time spent: 34 minutes    Sharen Hones, MD [facility]   To contact the attending provider between 7A-7P or the covering provider during after hours 7P-7A, please log into the web site [email or web address] and access using universal [place] password for that web site. If you do not have the password, please call the hospital operator.  01/23/2021, 10:38 AM

## 2021-01-23 NOTE — Evaluation (Signed)
Occupational Therapy Evaluation Patient Details Name: Lisa Gamble MRN: [id number] DOB: 1925/12/17 Today's Date: 01/23/2021    History of Present Illness Patient is a  87 year old female with past medical history of only asthma, anxiety and arthritis who present to the emergency room fever, altered mental status.  Significant leukocytosis, tachycardia.  She developed hypotension. She is diagnosed with septic shock due to UTI.  CT scan showed left-sided 8 mm ureteral stone with hydronephrosis. She had a ureter stent placed on 8/26.  Postoperatively, patient developed respiratory failure requiring mechanical ventilation and vasopressors.   Clinical Impression   Ms Rauch was seen for OT/PT co-evaluation this date. Prior to hospital admission, pt was MOD I for mobility and ADLs. Pt lives alone in [place] c 4-5 STE. Pt presents to acute OT demonstrating impaired ADL performance and functional mobility 2/2 decreased activity tolerance, poor insight into deficits, and functional strength/balance deficits. Pt currently requires MOD A don B socks seated EOB. MAX A x2 for BSC t/f - requires cues and +2 for lines/leads management. MAX A x2 for perihygiene standing, tolerates ~2 min standing.  Pt completed SLUMS examination this date scoring 5/30 indicating a positive screen for dementia. The SLUMS is a screening questionnaire that tests orientation, memory, attention, and executive function - a positive screen indicates need for further testing. Pt with noted impairments in short term and working memory, attention, problem solving, and orientation limiting ability to participate functionally in ADLs.  Pt would benefit from skilled OT to address noted impairments and functional limitations (see below for any additional details) in order to maximize safety and independence while minimizing falls risk and caregiver burden. Upon hospital discharge, recommend STR to maximize pt safety and return to PLOF.      Follow Up Recommendations  SNF;Supervision/Assistance - 24 hour    Equipment Recommendations  Other (comment) (TBD at next venue of care)    Recommendations for Other Services       Precautions / Restrictions Precautions Precautions: Fall Restrictions Weight Bearing Restrictions: No      Mobility Bed Mobility Overal bed mobility: Needs Assistance Bed Mobility: Supine to Sit;Sit to Supine     Supine to sit: Min assist;+2 for safety/equipment Sit to supine: Max assist;+2 for safety/equipment   General bed mobility comments: assistance for LE and trunk support. verbal cues for technique. second person required for line management    Transfers Overall transfer level: Needs assistance Equipment used: None Transfers: Sit to/from [facility] Sit to Stand: Max assist;+2 safety/equipment Stand pivot transfers: Max assist;+2 safety/equipment       General transfer comment: verbal cues for hand placement for safety. lifting and lowering assistance provided    Balance Overall balance assessment: Needs assistance Sitting-balance support: Feet supported Sitting balance-Leahy Scale: Fair Sitting balance - Comments: close stand by assistance provided for safety Postural control: Posterior lean Standing balance support: Bilateral upper extremity supported Standing balance-Leahy Scale: Poor Standing balance comment: Mod A- Max A with cues for upright posture.                           ADL either performed or assessed with clinical judgement   ADL Overall ADL's : Needs assistance/impaired                                       General ADL Comments: MOD A don B  socks seated EOB. MAX A x2 for BSC t/f - requires cues and +2 for lines/leads management. MAX A x2 for perihygiene standing - toelrates ~2 min standing.      Pertinent Vitals/Pain Pain Assessment: Faces Faces Pain Scale: Hurts a little bit Pain Location: "belly" Pain  Descriptors / Indicators: Discomfort Pain Intervention(s): Limited activity within patient's tolerance     Hand Dominance Right   Extremity/Trunk Assessment Upper Extremity Assessment Upper Extremity Assessment: Generalized weakness   Lower Extremity Assessment Lower Extremity Assessment: Generalized weakness       Communication Communication Communication: HOH   Cognition Arousal/Alertness: Awake/alert Behavior During Therapy: WFL for tasks assessed/performed Overall Cognitive Status: History of cognitive impairments - at baseline                                 General Comments: SLUMS score 5/30   General Comments  patient was fatigued with activity. Sp02 in the 90's with intermitent decrease into the high 80's (question if accurate reading). heart rate does increase to the 130's with activity    Exercises Exercises: Other exercises Other Exercises Other Exercises: Pt educated re: OT role, DME recs, falls prevention, ECS, re-oriented to situation frequently Other Exercises: LBD, toileting, sup<>sit, sit<>stand, sitting/standing balance/tolerance, SLUMS, self-feeding   Shoulder Instructions      Home Living Family/patient expects to be discharged to:: Private residence Living Arrangements: Alone Available Help at Discharge: Family;Available PRN/intermittently Type of Home:  (condo) Home Access: Stairs to enter [employer] of Steps: 4-5   Home Layout: One level               Home Equipment: Cane - single point          Prior Functioning/Environment Level of Independence: Independent with assistive device(s)        Comments: patient lives alone. rarely leaves the house and uses a cane PRN for ambulation. family comfirms patient uses furniture for support to walk at home.        OT Problem List: Decreased strength;Decreased activity tolerance;Impaired balance (sitting and/or standing);Decreased safety awareness;Decreased  cognition      OT Treatment/Interventions: Self-care/ADL training;Therapeutic exercise;Energy conservation;DME and/or AE instruction;Therapeutic activities;Patient/family education;Balance training;Cognitive remediation/compensation    OT Goals(Current goals can be found in the care plan section) Acute Rehab OT Goals Patient Stated Goal: son wants patient to get to rehab OT Goal Formulation: With family Time For Goal Achievement: 02/06/21 Potential to Achieve Goals: Good ADL Goals Pt Will Perform Grooming: standing;with min assist (c LRAD PRN) Pt Will Transfer to Toilet: with supervision;ambulating;bedside commode (c LRAD PRN) Pt Will Perform Toileting - Clothing Manipulation and hygiene: with modified independence;sitting/lateral leans  OT Frequency: Min 2X/week   Barriers to D/C: Decreased caregiver support;Inaccessible home environment          Co-evaluation PT/OT/SLP Co-Evaluation/Treatment: Yes Reason for Co-Treatment: Complexity of the patient's impairments (multi-system involvement);To address functional/ADL transfers PT goals addressed during session: Mobility/safety with mobility OT goals addressed during session: ADL's and self-care      AM-PAC OT "6 Clicks" Daily Activity     Outcome Measure Help from another person eating meals?: A Little Help from another person taking care of personal grooming?: A Little Help from another person toileting, which includes using toliet, bedpan, or urinal?: A Lot Help from another person bathing (including washing, rinsing, drying)?: A Lot Help from another person to put on and taking off regular upper body clothing?: A  Little Help from another person to put on and taking off regular lower body clothing?: A Lot 6 Click Score: 15   End of Session Nurse Communication: Mobility status  Activity Tolerance: Patient tolerated treatment well Patient left: in bed;with call bell/phone within reach  OT Visit Diagnosis: Other abnormalities  of gait and mobility (R26.89);Muscle weakness (generalized) (M62.81)                Time: [id number] OT Time Calculation (min): 50 min Charges:  OT General Charges $OT Visit: 1 Visit OT Evaluation $OT Eval Moderate Complexity: 1 Mod OT Treatments $Self Care/Home Management : 23-37 mins  Dessie Coma, M.S. OTR/L  01/23/21, 3:53 PM  ascom [phone]

## 2021-01-23 NOTE — Evaluation (Signed)
Physical Therapy Evaluation Patient Details Name: Lisa Gamble MRN: [id number] DOB: [birth year] Today's Date: 01/23/2021   History of Present Illness  Patient is a  87 year old female with past medical history of only asthma, anxiety and arthritis who present to the emergency room fever, altered mental status.  Significant leukocytosis, tachycardia.  She developed hypotension. She is diagnosed with septic shock due to UTI.  CT scan showed left-sided 8 mm ureteral stone with hydronephrosis. She had a ureter stent placed on 8/26.  Postoperatively, patient developed respiratory failure requiring mechanical ventilation and vasopressors.   Clinical Impression  Patient agreeable to PT. Family at bedside at the start of session and leaves the room during evaluation. Son expressed concerns about patient being able to safely go home alone and is hopeful for SNF placement.  Patient is confused but able to follow commands with extra time. She needs assistance to get up from bed. Max A required for transfers, sit to stand and stand pivot transfers. Patient was able to take several side steps along edge of bed with Max A and facilitation for posterior lean. Patient has limited standing tolerance, generalized weakness,  and impaired endurance overall. Patient is not at her baseline level of functional mobility and would benefit from SNF placement for ongoing PT efforts. Recommend to continue PT to maximize independence and return to prior level of function.     Follow Up Recommendations SNF    Equipment Recommendations  Other (comment) (to be determined at next level of care)    Recommendations for Other Services       Precautions / Restrictions Precautions Precautions: Fall Restrictions Weight Bearing Restrictions: No      Mobility  Bed Mobility Overal bed mobility: Needs Assistance Bed Mobility: Supine to Sit;Sit to Supine     Supine to sit: Min assist;+2 for safety/equipment Sit to  supine: Max assist;+2 for safety/equipment   General bed mobility comments: assistance for LE and trunk support. verbal cues for technique. second person required for line management    Transfers Overall transfer level: Needs assistance Equipment used: None Transfers: Sit to/from [facility] Sit to Stand: Max assist;+2 safety/equipment Stand pivot transfers: Max assist;+2 safety/equipment       General transfer comment: verbal cues for hand placement for safety. lifting and lowering assistance provided  Ambulation/Gait Ambulation/Gait assistance: Max assist;+2 safety/equipment Gait Distance (Feet): 2 Feet Assistive device: None   Gait velocity: decreased   General Gait Details: patient able to take 3-4 small side steps with Max A. facilitation for upright posture as patient with posterior lean. decreased activity [place] overall for standing activity  Stairs            Wheelchair Mobility    Modified Rankin (Stroke Patients Only)       Balance Overall balance assessment: Needs assistance Sitting-balance support: Feet supported Sitting balance-Leahy Scale: Fair Sitting balance - Comments: close stand by assistance provided for safety Postural control: Posterior lean Standing balance support: Bilateral upper extremity supported Standing balance-Leahy Scale: Poor Standing balance comment: Mod A- Max A with cues for upright posture.                             Pertinent Vitals/Pain Pain Assessment: Faces Faces Pain Scale: Hurts a little bit Pain Location: "belly" Pain Descriptors / Indicators: Discomfort Pain Intervention(s): Limited activity within patient's tolerance    Home Living Family/patient expects to be discharged to:: Private residence Living Arrangements: Alone  Available Help at Discharge: Family;Available PRN/intermittently Type of Home:  (condo) Home Access: Stairs to enter   [employer] of Steps:  4-5 Home Layout: One level Home Equipment: Cane - single point      Prior Function Level of Independence: Independent with assistive device(s)         Comments: patient lives alone. rarely leaves the house and uses a cane PRN for ambulation. family comfirms patient uses furniture for support to walk at home.     Hand Dominance        Extremity/Trunk Assessment   Upper Extremity Assessment Upper Extremity Assessment: Generalized weakness    Lower Extremity Assessment Lower Extremity Assessment: Generalized weakness       Communication   Communication: HOH  Cognition Arousal/Alertness: Awake/alert Behavior During Therapy: WFL for tasks assessed/performed Overall Cognitive Status: History of cognitive impairments - at baseline                                 General Comments: patient is able to follow commands wiht extra time. patient needs increased time for processing and following commands      General Comments General comments (skin integrity, edema, etc.): patient was fatigued with activity. Sp02 in the 90's with intermitent decrease into the high 80's (question if accurate reading). heart rate does increase to the 130's with activity    Exercises     Assessment/Plan    PT Assessment Patient needs continued PT services  PT Problem List Decreased strength;Decreased activity tolerance;Decreased balance;Decreased mobility;Decreased cognition;Decreased knowledge of use of DME;Decreased safety awareness;Decreased knowledge of precautions       PT Treatment Interventions DME instruction;Gait training;Stair training;Functional mobility training;Therapeutic activities;Balance training;Therapeutic exercise;Neuromuscular re-education;Cognitive remediation;Patient/family education    PT Goals (Current goals can be found in the Care Plan section)  Acute Rehab PT Goals Patient Stated Goal: son wants patient to get to rehab PT Goal Formulation: With  family Time For Goal Achievement: 02/06/21 Potential to Achieve Goals: Fair    Frequency Min 2X/week   Barriers to discharge Decreased caregiver support      Co-evaluation PT/OT/SLP Co-Evaluation/Treatment: Yes Reason for Co-Treatment: Complexity of the patient's impairments (multi-system involvement) PT goals addressed during session: Mobility/safety with mobility OT goals addressed during session: ADL's and self-care       AM-PAC PT "6 Clicks" Mobility  Outcome Measure Help needed turning from your back to your side while in a flat bed without using bedrails?: A Lot Help needed moving from lying on your back to sitting on the side of a flat bed without using bedrails?: A Lot Help needed moving to and from a bed to a chair (including a wheelchair)?: A Lot Help needed standing up from a chair using your arms (e.g., wheelchair or bedside chair)?: A Lot Help needed to walk in hospital room?: A Lot Help needed climbing 3-5 steps with a railing? : Total 6 Click Score: 11    End of Session   Activity Tolerance: Patient limited by fatigue Patient left: in bed;with call bell/phone within reach Nurse Communication: Mobility status PT Visit Diagnosis: Unsteadiness on feet (R26.81);Muscle weakness (generalized) (M62.81);Other abnormalities of gait and mobility (R26.89)    Time: [id number] PT Time Calculation (min) (ACUTE ONLY): 33 min   Charges:   PT Evaluation $PT Eval Moderate Complexity: 1 Mod PT Treatments $Therapeutic Activity: 8-22 mins        Minna Merritts, PT, MPT   Orson Gear  Quentin Cornwall 01/23/2021, 3:19 PM

## 2021-01-23 NOTE — Progress Notes (Signed)
Initial Nutrition Assessment  DOCUMENTATION CODES:  Non-severe (moderate) malnutrition in context of social or environmental circumstances  INTERVENTION:  Upgrade diet to DYS 2, encourage PO intake Ensure Enlive po BID, each supplement provides 350 kcal and 20 grams of protein Magic cup TID with meals, each supplement provides 290 kcal and 9 grams of protein  NUTRITION DIAGNOSIS:  Moderate Malnutrition related to social / environmental circumstances as evidenced by mild muscle depletion, mild fat depletion, moderate muscle depletion.  GOAL:  Patient will meet greater than or equal to 90% of their needs  MONITOR:  PO intake, Supplement acceptance, Labs  REASON FOR ASSESSMENT:  Malnutrition Screening Tool    ASSESSMENT:  87 y.o. female with limited PMH brought to ED from home for AMS and fever. Found to be septic in ED due to a ureteral stone with upstream hydronephrosis and multiple renal stones. Taken for emergent cystoscopy and left ureteral stent placement.  Pt admitted to ICU after surgery 8/26 on the ventilator and requiring multiple pressors. Extubated 8/27, but remains confused at this time. Resting in bed at the time of assessment. Pt awake and conversant, but not able to provide a nutrition history. Did report that she likes to eat yogurt and berries at home.  Pt repeatedly asking if she can go home and where her bottom dentures are. MD suspects that pt has dementia at baseline. Pt reports living alone and being independent.  Discussed NPO status with RN, reports that pt passed bedside swallow but that pt's diet was not advanced. Confirms that pt is missing her top dentures, lowers are in place. Spoke with MD, ok to advance to DYS 2 diet, if unable to adequately chew without top dentures would downgrade to DYS 1   Intake/Output Summary (Last 24 hours) at 01/23/2021 1318 Last data filed at 01/23/2021 0500 Gross per 24 hour  Intake 882.93 ml  Output 1925 ml  Net -1042.07 ml   Net IO Since Admission: 4,525.54 mL [01/23/21 1318]  Nutritionally Relevant Medications: Scheduled Meds:  insulin aspart  0-15 Units Subcutaneous Q4H   potassium chloride  20 mEq Oral Q4H   Continuous Infusions:  potassium chloride 10 mEq (01/23/21 0813)    sodium bicarbonate (isotonic) infusion in sterile water 75 mL/hr at 01/22/21 1743   PRN Meds: bisacodyl, docusate sodium, ondansetron, polyethylene glycol, simethicone  Labs Reviewed: K 3.0 Phosphorus 2.4 Calcium corrects to 8.26 for low albumin SBG ranges from 77-99 mg/dL over the last 24 hours  NUTRITION - FOCUSED PHYSICAL EXAM: Flowsheet Row Most Recent Value  Orbital Region Mild depletion  Upper Arm Region No depletion  Thoracic and Lumbar Region No depletion  Buccal Region Mild depletion  Temple Region Moderate depletion  Clavicle Bone Region Mild depletion  Clavicle and Acromion Bone Region Moderate depletion  Scapular Bone Region Moderate depletion  Dorsal Hand Mild depletion  Patellar Region No depletion  Anterior Thigh Region No depletion  Posterior Calf Region Mild depletion  Edema (RD Assessment) Mild  Hair Reviewed  Eyes Reviewed  Mouth Reviewed  [dentures to the top and bottom - pt does not have top with her]  Skin Reviewed  Nails Reviewed   Diet Order:   Diet Order             DIET DYS 2 Room service appropriate? Yes; Fluid consistency: Thin  Diet effective now                   EDUCATION NEEDS:  No education needs have been  identified at this time  Skin:  Skin Assessment: Reviewed RN Assessment  Last BM:  8/28 - type 6  Height:  Ht Readings from Last 1 Encounters:  01/20/21 '5\' 3"'$  (1.6 m)    Weight:  Wt Readings from Last 1 Encounters:  01/23/21 59.9 kg    Ideal Body Weight:  52.3 kg  BMI:  Body mass index is 23.39 kg/m.  Estimated Nutritional Needs:  Kcal:  1400-1600 kcal/d Protein:  70-80 Fluid:  1.5-1.8L/d   Ranell Patrick, RD, LDN Clinical Dietitian Pager on  [place]

## 2021-01-23 NOTE — TOC Progression Note (Signed)
Transition of Care [facility]) - Progression Note    Patient Details  Name: SAMINA NEVEL MRN: [id number] Date of Birth: 11/24/1925  Transition of Care [facility]) CM/SW [place], RN Phone Number: 01/23/2021, 12:48 PM  Clinical Narrative:  Spoke with Son, Joneen Boers and Daughter Lelon Frohlich, they voice concern about patients safety living at home alone. Son, who is POA says he do check on her 1-2 times a week, patient is stubborn, unable to do ADL's, only take Tylenol, no PCP and unable to do shopping or cooking. Son says patient get MOW's once a day, he does shopping for Mom weekly and help pay bills. Daughter, Lelon Frohlich lives in [place]. Patient uses [facility], however still has gait instability. Spoke with patient, who is oriented to name and date of birth, place however unable to recall incident of falling that led to admission. Patient says she is unable to do cooking or shopping, Son takes care of this. Patient says she is unable to do ADL's and have not been to the doctor in years. Patient states she can live at home, if her Son comes over daily. I did discuss that the medical team is concerned about her safety living at home alone and may require that she is discharged to a facility, patient mentions that her Mother died in a facility at age 36 and she is 68, therefore she may not have long to live. Patient did agree to trying a facility however concerned about transportation. I did inform her that we will provide transportation and she says Madaline Brilliant will discuss with her son and daughter after they return from getting her dentures. TOC will proceed with SNF placement after PT/OT evaluation. Family understands the process of waiting for order and did not have a facility preference offered a list however will wait until bed offers.    Expected Discharge Plan: [place] Barriers to Discharge: Continued Medical Work up  Expected Discharge Plan and Services Expected Discharge Plan: [place] In-house Referral: Clinical Social Work   Post Acute Care Choice: [place] arrangements for the past 2 months: Apartment                 DME Arranged: N/A DME Agency: NA       HH Arranged: NA [place] Agency: NA         Social Determinants of Health (SDOH) Interventions    Readmission Risk Interventions No flowsheet data found.

## 2021-01-24 DIAGNOSIS — N179 Acute kidney failure, unspecified: Secondary | ICD-10-CM

## 2021-01-24 DIAGNOSIS — E440 Moderate protein-calorie malnutrition: Secondary | ICD-10-CM | POA: Insufficient documentation

## 2021-01-24 DIAGNOSIS — F430 Acute stress reaction: Secondary | ICD-10-CM | POA: Diagnosis not present

## 2021-01-24 DIAGNOSIS — R4182 Altered mental status, unspecified: Secondary | ICD-10-CM

## 2021-01-24 LAB — PHOSPHORUS: Phosphorus: 2.1 mg/dL — ABNORMAL LOW (ref 2.5–4.6)

## 2021-01-24 LAB — BASIC METABOLIC PANEL
Anion gap: 10 (ref 5–15)
BUN: 26 mg/dL — ABNORMAL HIGH (ref 8–23)
CO2: 29 mmol/L (ref 22–32)
Calcium: 7.5 mg/dL — ABNORMAL LOW (ref 8.9–10.3)
Chloride: 102 mmol/L (ref 98–111)
Creatinine, Ser: 0.93 mg/dL (ref 0.44–1.00)
GFR, Estimated: 57 mL/min — ABNORMAL LOW (ref >60)
Glucose, Bld: 97 mg/dL (ref 70–99)
Potassium: 3 mmol/L — ABNORMAL LOW (ref 3.5–5.1)
Sodium: 141 mmol/L (ref 135–145)

## 2021-01-24 LAB — CBC WITH DIFFERENTIAL/PLATELET
Abs Immature Granulocytes: 0.07 10*3/uL (ref 0.00–0.07)
Basophils Absolute: 0.1 10*3/uL (ref 0.0–0.1)
Basophils Relative: 0 %
Eosinophils Absolute: 0 10*3/uL (ref 0.0–0.5)
Eosinophils Relative: 0 %
HCT: 33.8 % — ABNORMAL LOW (ref 36.0–46.0)
Hemoglobin: 10.9 g/dL — ABNORMAL LOW (ref 12.0–15.0)
Immature Granulocytes: 0 %
Lymphocytes Relative: 6 %
Lymphs Abs: 1.1 10*3/uL (ref 0.7–4.0)
MCH: 30.4 pg (ref 26.0–34.0)
MCHC: 32.2 g/dL (ref 30.0–36.0)
MCV: 94.2 fL (ref 80.0–100.0)
Monocytes Absolute: 0.8 10*3/uL (ref 0.1–1.0)
Monocytes Relative: 4 %
Neutro Abs: 17.6 10*3/uL — ABNORMAL HIGH (ref 1.7–7.7)
Neutrophils Relative %: 90 %
Platelets: 65 10*3/uL — ABNORMAL LOW (ref 150–400)
RBC: 3.59 MIL/uL — ABNORMAL LOW (ref 3.87–5.11)
RDW: 13.8 % (ref 11.5–15.5)
WBC: 19.6 10*3/uL — ABNORMAL HIGH (ref 4.0–10.5)
nRBC: 0 % (ref 0.0–0.2)

## 2021-01-24 LAB — THYROID PANEL WITH TSH
Free Thyroxine Index: 2 (ref 1.2–4.9)
T3 Uptake Ratio: 33 % (ref 24–39)
T4, Total: 6.1 ug/dL (ref 4.5–12.0)
TSH: 1.97 u[IU]/mL (ref 0.450–4.500)

## 2021-01-24 LAB — MAGNESIUM: Magnesium: 1.9 mg/dL (ref 1.7–2.4)

## 2021-01-24 MED ORDER — POTASSIUM PHOSPHATES 15 MMOLE/5ML IV SOLN
20.0000 mmol | Freq: Once | INTRAVENOUS | Status: AC
  Administered 2021-01-24: 20 mmol via INTRAVENOUS
  Filled 2021-01-24: qty 6.67

## 2021-01-24 MED ORDER — POTASSIUM CHLORIDE 10 MEQ/100ML IV SOLN
10.0000 meq | INTRAVENOUS | Status: AC
  Administered 2021-01-24 (×2): 10 meq via INTRAVENOUS
  Filled 2021-01-24 (×2): qty 100

## 2021-01-24 MED ORDER — HALOPERIDOL LACTATE 5 MG/ML IJ SOLN: 1.0000 mg | Freq: Four times a day (QID) | INTRAMUSCULAR | Status: DC | PRN

## 2021-01-24 NOTE — Consult Note (Signed)
[place] for Electrolyte Monitoring and Replacement   Recent Labs: Potassium (mmol/L)  Date Value  01/24/2021 3.0 (L)  02/07/2013 3.7   Magnesium (mg/dL)  Date Value  01/24/2021 1.9   Calcium (mg/dL)  Date Value  01/24/2021 7.5 (L)   Calcium, Total (mg/dL)  Date Value  02/07/2013 8.5   Albumin (g/dL)  Date Value  01/23/2021 2.3 (L)   Phosphorus (mg/dL)  Date Value  01/24/2021 2.1 (L)   Sodium (mmol/L)  Date Value  01/24/2021 141  02/07/2013 141   Corrected Ca: 8.43 mg/dL  Assessment:  87 y.o. year old presented with sepsis, left ureteral stone, AKI requiring sent placement/drainage. Admitted to ICU for vasopressor support. Pharmacy has been consulted for monitoring and replacement of electrolytes as needed. After K supplementation yesterday, K unchanged. CrCl 31.3. On mIVF and Duonebs. Phos also trending down and low today at 2.1.  MIVF: LR at 50 mL/hr  8/29 Replacement:  20 mEq PO KCl x 2 per NP 10 mEq IV KCl x 4 per NP  Goal of Therapy:  Electrolytes WNL  Plan:  Kcl IV 10 mEq x 2 per Dr. Roosevelt Locks K Phos 20 mmol x 1 per Dr. Roosevelt Locks Continue to monitor and replete as needed   Wynelle Cleveland, PharmD Pharmacy Resident  01/24/2021 7:32 AM

## 2021-01-24 NOTE — TOC Progression Note (Signed)
Transition of Care [facility]) - Progression Note    Patient Details  Name: Lisa Gamble MRN: [id number] Date of Birth: [birth year]  Transition of [facility]) CM/SW [place], RN Phone Number: 01/24/2021, 2:43 PM  Clinical Narrative:   RNCM in to see family, they state she lives alone and is not safe to return home, as she has no hygiene practices, she does not eat or drink at home and her cognitive status has been declining.  Family feels patient is confused and questions if she has cognitive issues or can make appropriate decisions about a safe discharge.  Upon [facility] discussion with patient, she was only oriented to self after being asked several times.  She is not oriented to place, time or situation.  Patient was not aware of being ill, states she has no idea why she cannot return home, as there is no reason for her to be "in this sort of place".  When RNCM spoke to patient that returning home would not be safe, patient did not verbalize understanding of conversation, and she was focused on the food in front of her, repeatedly stating that it tasted okay and she couldn't eat it all.  She also stated that she likes Meals on Wheels but cannot eat the food.    Spoke with family and MD, psychiatric consult ordered per Dr. Roosevelt Locks, awaiting results.    Expected Discharge Plan: [place] Barriers to Discharge: Continued Medical Work up  Expected Discharge Plan and Services Expected Discharge Plan: [place] In-house Referral: Clinical Social Work   Post Acute Care Choice: [place] arrangements for the past 2 months: Apartment                 DME Arranged: N/A DME Agency: NA       HH Arranged: NA [place] Agency: NA         Social Determinants of Health (SDOH) Interventions    Readmission Risk Interventions No flowsheet data found.

## 2021-01-24 NOTE — Progress Notes (Signed)
PROGRESS NOTE    Lisa Gamble  [id number] DOB: [birth year] DOA: 01/20/2021 PCP: [name], MD   Chief complaint.  Altered mental status. Brief Narrative:  Lisa Gamble is a 87 year old female with past medical history of only asthma, anxiety and arthritis who present to the emergency room fever, altered mental status.  She also had a significant leukocytosis, tachycardia.  She developed hypotension, with a lactic acid level 10.9.  She is diagnosed with septic shock due to urinary tract infection.  CT scan showed left-sided 8 mm ureteral stone with hydronephrosis.  He received IV fluids, broad-spectrum antibiotics.  She had a ureter stent placed on 8/26.  Postoperatively, patient developed respiratory failure requiring mechanical ventilation. Patient blood culture has grew E. coli, urine culture also positive E. coli. Patient has been extubated, transferred to hospitalist service on 8/28.   Assessment & Plan:   Active Problems:   Septic shock (HCC)   SVT (supraventricular tachycardia) (HCC)   AKI (acute kidney injury) ([place])   Increased anion gap metabolic acidosis   Postoperative respiratory failure (HCC)   Endotracheally intubated   Hyperglycemia   Acute metabolic encephalopathy   Obstruction of left ureteropelvic junction (UPJ) due to stone   Malnutrition of moderate degree  #1.  Severe sepsis with septic shock. E. coli septicemia. E. coli pyelonephritis. Left ureteral stone with hydronephrosis.  Status post ureteral stent. Acute hypoxemic respite failure secondary to septic shock. Blood culture finalized with E. coli pansensitive. Patient will be able to treat with oral Cipro for 2 weeks. Patient condition so far had improved, currently she is off oxygen. She has been evaluated by physical therapy, need nursing home placement.  Patient refused to go to nursing home, will obtain psych consult to determine her capacity to make decisions.  Most likely family will  decide to place her in a nursing home.  2.  Acute kidney injury secondary to septic shock and obstruction. Anion gap metabolic acidosis with severe lactic acidosis. Hypokalemia. Hypophosphatemia. Renal function has improved, no metabolic acidosis. Appetite still poor, she is still on lower dose fluids. Continue replete potassium and phosphate.  #3.  Acute metabolic encephalopathy. Dementia. Condition stable.  4.  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 Outpatient work-up.  5.  Severe thrombocytopenia. Secondary to septic shock. Patient never had heparin.  Continue to follow.    DVT prophylaxis: SCDs Code Status: DNR Family Communication: Son updated at the bedside Disposition Plan:    Status is: Inpatient  Remains inpatient appropriate because:IV treatments appropriate due to intensity of illness or inability to take PO and Inpatient level of care appropriate due to severity of illness  Dispo: The patient is from: Home              Anticipated d/c is to: SNF              Patient currently is not medically stable to d/c.   Difficult to place patient No        I/O last 3 completed shifts: In: 677.4 [I.V.:480.4; IV Piggyback:197] Out: 3400 [Urine:3400] No intake/output data recorded.     Consultants:  None  Procedures:   Antimicrobials: Cefazolin.  Subjective: Patient has some baseline confusion, she has no agitation. She started eating, but appetite is inadequate. No short of breath or cough. No abdominal pain or nausea vomiting. No dysuria hematuria.  Objective: Vitals:   01/23/21 2106 01/24/21 0431 01/24/21 0735 01/24/21 1211  BP: (!) 155/76 (!) 156/89 129/88 (!) 143/84  Pulse: 96 (!)  101 92 68  Resp: '18 18 15   ' Temp: 98.2 F (36.8 C) 98.1 F (36.7 C) 97.9 F (36.6 C) 99.2 F (37.3 C)  TempSrc: Oral Oral  Oral  SpO2: 93% 92% (!) 86% 93%  Weight:      Height:        Intake/Output Summary (Last 24 hours) at 01/24/2021 1600 Last data filed at  01/24/2021 1425 Gross per 24 hour  Intake 121.68 ml  Output 1950 ml  Net -1828.32 ml   Filed Weights   01/21/21 0318 01/22/21 0500 01/23/21 0500  Weight: 56.2 kg 61.3 kg 59.9 kg    Examination:  General exam: Appears calm and comfortable  Respiratory system: Clear to auscultation. Respiratory effort normal. Cardiovascular system: S1 & S2 heard, RRR. No JVD, murmurs, rubs, gallops or clicks. No pedal edema. Gastrointestinal system: Abdomen is nondistended, soft and nontender. No organomegaly or masses felt. Normal bowel sounds heard. Central nervous system: Alert and oriented x2. No focal neurological deficits. Extremities: Symmetric 5 x 5 power. Skin: No rashes, lesions or ulcers Psychiatry:Mood & affect appropriate.     Data Reviewed: I have personally reviewed following labs and imaging studies  CBC: Recent Labs  Lab 01/20/21 1142 01/21/21 0417 01/22/21 0615 01/23/21 0459 01/24/21 0423  WBC 10.0 47.1* 20.4* 23.8* 19.6*  NEUTROABS 9.3* 42.2* 16.5* 22.1* 17.6*  HGB 13.1 11.8* 10.7* 10.7* 10.9*  HCT 39.9 35.3* 31.4* 32.0* 33.8*  MCV 94.1 93.6 90.8 91.4 94.2  PLT 165 168 62* 53* 65*   Basic Metabolic Panel: Recent Labs  Lab 01/20/21 1142 01/20/21 1809 01/20/21 1900 01/21/21 0417 01/22/21 0615 01/23/21 0459 01/24/21 0423  NA 140  --   --  137 138 137 141  K 3.6  --  3.1* 3.3* 4.1 3.0* 3.0*  CL 103  --   --  109 106 102 102  CO2 19*  --   --  21* '27 28 29  ' GLUCOSE 158*  --   --  179* 101* 78 97  BUN 38*  --   --  31* 31* 23 26*  CREATININE 1.44*  --   --  0.98 0.85 0.83 0.93  CALCIUM 7.7*  --  6.5* 7.0* 7.1* 6.9* 7.5*  MG  --   --  1.4* 2.4 2.0 1.9 1.9  PHOS  --  3.4  --  3.3 2.7 2.4* 2.1*   GFR: Estimated Creatinine Clearance: 31.3 mL/min (by C-G formula based on SCr of 0.93 mg/dL). Liver Function Tests: Recent Labs  Lab 01/20/21 1142 01/21/21 0417 01/22/21 0615 01/23/21 0459  AST 45* 35 26 23  ALT '18 17 17 16  ' ALKPHOS 60 45 66 74  BILITOT 1.4*  0.9 0.9 1.1  PROT 5.3* 4.1* 4.1* 4.2*  ALBUMIN 3.1* 2.3* 2.1* 2.3*   No results for input(s): LIPASE, AMYLASE in the last 168 hours. No results for input(s): AMMONIA in the last 168 hours. Coagulation Profile: Recent Labs  Lab 01/20/21 1142  INR 1.3*   Cardiac Enzymes: No results for input(s): CKTOTAL, CKMB, CKMBINDEX, TROPONINI in the last 168 hours. BNP (last 3 results) No results for input(s): PROBNP in the last 8760 hours. HbA1C: No results for input(s): HGBA1C in the last 72 hours. CBG: Recent Labs  Lab 01/22/21 1606 01/22/21 1932 01/22/21 2339 01/23/21 0458 01/23/21 0803  GLUCAP 90 93 99 79 77   Lipid Profile: No results for input(s): CHOL, HDL, LDLCALC, TRIG, CHOLHDL, LDLDIRECT in the last 72 hours. Thyroid Function Tests: Recent Labs  01/23/21 0459  TSH 1.970  T4TOTAL 6.1   Anemia Panel: No results for input(s): VITAMINB12, FOLATE, FERRITIN, TIBC, IRON, RETICCTPCT in the last 72 hours. Sepsis Labs: Recent Labs  Lab 01/20/21 1142 01/20/21 1340 01/20/21 1940 01/20/21 2057 01/21/21 0417 01/21/21 1156 01/22/21 0615  PROCALCITON 58.25  --   --   --  >150.00  --  109.80  LATICACIDVEN 10.9*   < > 5.8* 6.5* 5.6* 5.1*  --    < > = values in this interval not displayed.    Recent Results (from the past 240 hour(s))  Culture, blood (Routine x 2)     Status: None (Preliminary result)   Collection Time: 01/20/21 11:42 AM   Specimen: BLOOD  Result Value Ref Range Status   Specimen Description BLOOD BLOOD RIGHT FOREARM  Final   Special Requests   Final    BOTTLES DRAWN AEROBIC AND ANAEROBIC Blood Culture results may not be optimal due to an inadequate volume of blood received in culture bottles   Culture   Final    NO GROWTH 4 DAYS Performed at [facility], [address], [place], [place] [address]    Report Status PENDING  Incomplete  Culture, blood (Routine x 2)     Status: Abnormal   Collection Time: 01/20/21 11:42 AM   Specimen: BLOOD   Result Value Ref Range Status   Specimen Description   Final    BLOOD LEFT ANTECUBITAL Performed at [facility], [address] [place], [place] [address]    Special Requests   Final    BOTTLES DRAWN AEROBIC AND ANAEROBIC Blood Culture results may not be optimal due to an inadequate volume of blood received in culture bottles Performed at [facility], [address] [place], [place] [address]    Culture  Setup Time   Final    GRAM NEGATIVE RODS ANAEROBIC BOTTLE ONLY CRITICAL RESULT CALLED TO, READ BACK BY AND VERIFIED WITH: CAROLINE CHILDS AT 1129 01/21/21.PMF Performed at [facility], [place] [address] [place], [place] [address]    Culture ESCHERICHIA COLI (A)  Final   Report Status 01/23/2021 FINAL  Final   Organism ID, Bacteria ESCHERICHIA COLI  Final      Susceptibility   Escherichia coli - MIC*    AMPICILLIN 8 SENSITIVE Sensitive     CEFAZOLIN <=4 SENSITIVE Sensitive     CEFEPIME <=0.12 SENSITIVE Sensitive     CEFTAZIDIME <=1 SENSITIVE Sensitive     CEFTRIAXONE <=0.25 SENSITIVE Sensitive     CIPROFLOXACIN <=0.25 SENSITIVE Sensitive     GENTAMICIN <=1 SENSITIVE Sensitive     IMIPENEM 1 SENSITIVE Sensitive     TRIMETH/SULFA <=20 SENSITIVE Sensitive     AMPICILLIN/SULBACTAM <=2 SENSITIVE Sensitive     PIP/TAZO <=4 SENSITIVE Sensitive     * ESCHERICHIA COLI  Urine Culture     Status: Abnormal   Collection Time: 01/20/21 11:42 AM   Specimen: Urine, Clean Catch  Result Value Ref Range Status   Specimen Description   Final    URINE, CLEAN CATCH Performed at [facility], [address], [place], [place] [address]    Special Requests   Final    NONE Performed at [facility], [address] [place], [place] [address]    Culture >=100,000 COLONIES/mL ESCHERICHIA COLI (A)  Final   Report Status 01/23/2021 FINAL  Final   Organism ID, Bacteria ESCHERICHIA COLI (A)  Final      Susceptibility  Escherichia coli - MIC*     AMPICILLIN <=2 SENSITIVE Sensitive     CEFAZOLIN <=4 SENSITIVE Sensitive     CEFEPIME <=0.12 SENSITIVE Sensitive     CEFTRIAXONE <=0.25 SENSITIVE Sensitive     CIPROFLOXACIN <=0.25 SENSITIVE Sensitive     GENTAMICIN <=1 SENSITIVE Sensitive     IMIPENEM <=0.25 SENSITIVE Sensitive     NITROFURANTOIN <=16 SENSITIVE Sensitive     TRIMETH/SULFA <=20 SENSITIVE Sensitive     AMPICILLIN/SULBACTAM <=2 SENSITIVE Sensitive     PIP/TAZO <=4 SENSITIVE Sensitive     * >=100,000 COLONIES/mL ESCHERICHIA COLI  Blood Culture ID Panel (Reflexed)     Status: Abnormal   Collection Time: 01/20/21 11:42 AM  Result Value Ref Range Status   Enterococcus faecalis NOT DETECTED NOT DETECTED Final   Enterococcus Faecium NOT DETECTED NOT DETECTED Final   Listeria monocytogenes NOT DETECTED NOT DETECTED Final   Staphylococcus species NOT DETECTED NOT DETECTED Final   Staphylococcus aureus (BCID) NOT DETECTED NOT DETECTED Final   Staphylococcus epidermidis NOT DETECTED NOT DETECTED Final   Staphylococcus lugdunensis NOT DETECTED NOT DETECTED Final   Streptococcus species NOT DETECTED NOT DETECTED Final   Streptococcus agalactiae NOT DETECTED NOT DETECTED Final   Streptococcus pneumoniae NOT DETECTED NOT DETECTED Final   Streptococcus pyogenes NOT DETECTED NOT DETECTED Final   A.calcoaceticus-baumannii NOT DETECTED NOT DETECTED Final   Bacteroides fragilis NOT DETECTED NOT DETECTED Final   Enterobacterales DETECTED (A) NOT DETECTED Final    Comment: Enterobacterales represent a large order of gram negative bacteria, not a single organism. CRITICAL RESULT CALLED TO, READ BACK BY AND VERIFIED WITH: CAROLINE CHILDS AT 1129 01/21/21.PMF    Enterobacter cloacae complex NOT DETECTED NOT DETECTED Final   Escherichia coli DETECTED (A) NOT DETECTED Final    Comment: CRITICAL RESULT CALLED TO, READ BACK BY AND VERIFIED WITH: CAROLINE CHILDS AT 1129 01/21/21.PMF    Klebsiella aerogenes NOT DETECTED NOT DETECTED Final    Klebsiella oxytoca NOT DETECTED NOT DETECTED Final   Klebsiella pneumoniae NOT DETECTED NOT DETECTED Final   Proteus species NOT DETECTED NOT DETECTED Final   Salmonella species NOT DETECTED NOT DETECTED Final   Serratia marcescens NOT DETECTED NOT DETECTED Final   Haemophilus influenzae NOT DETECTED NOT DETECTED Final   Neisseria meningitidis NOT DETECTED NOT DETECTED Final   Pseudomonas aeruginosa NOT DETECTED NOT DETECTED Final   Stenotrophomonas maltophilia NOT DETECTED NOT DETECTED Final   Candida albicans NOT DETECTED NOT DETECTED Final   Candida auris NOT DETECTED NOT DETECTED Final   Candida glabrata NOT DETECTED NOT DETECTED Final   Candida krusei NOT DETECTED NOT DETECTED Final   Candida parapsilosis NOT DETECTED NOT DETECTED Final   Candida tropicalis NOT DETECTED NOT DETECTED Final   Cryptococcus neoformans/gattii NOT DETECTED NOT DETECTED Final   CTX-M ESBL NOT DETECTED NOT DETECTED Final   Carbapenem resistance IMP NOT DETECTED NOT DETECTED Final   Carbapenem resistance KPC NOT DETECTED NOT DETECTED Final   Carbapenem resistance NDM NOT DETECTED NOT DETECTED Final   Carbapenem resist OXA 48 LIKE NOT DETECTED NOT DETECTED Final   Carbapenem resistance VIM NOT DETECTED NOT DETECTED Final    Comment: Performed at [facility], [address] [place], [place] [address]  Resp Panel by RT-PCR (Flu A&B, Covid) Nasopharyngeal Swab     Status: None   Collection Time: 01/20/21 12:21 PM   Specimen: Nasopharyngeal Swab; Nasopharyngeal(NP) swabs in vial transport medium  Result Value Ref Range Status   SARS Coronavirus 2 by  RT PCR NEGATIVE NEGATIVE Final    Comment: (NOTE) SARS-CoV-2 target nucleic acids are NOT DETECTED.  The SARS-CoV-2 RNA is generally detectable in upper respiratory specimens during the acute phase of infection. The lowest concentration of SARS-CoV-2 viral copies this assay can detect is 138 copies/mL. A negative result does not preclude  SARS-Cov-2 infection and should not be used as the sole basis for treatment or other patient management decisions. A negative result may occur with  improper specimen collection/handling, submission of specimen other than nasopharyngeal swab, presence of viral mutation(s) within the areas targeted by this assay, and inadequate number of viral copies(<138 copies/mL). A negative result must be combined with clinical observations, patient history, and epidemiological information. The expected result is Negative.  Fact Sheet for Patients:  EntrepreneurPulse.com.au  Fact Sheet for Healthcare Providers:  IncredibleEmployment.be  This test is no t yet approved or cleared by the [place] FDA and  has been authorized for detection and/or diagnosis of SARS-CoV-2 by FDA under an Emergency Use Authorization (EUA). This EUA will remain  in effect (meaning this test can be used) for the duration of the COVID-19 declaration under Section 564(b)(1) of the Act, 21 U.S.C.section 360bbb-3(b)(1), unless the authorization is terminated  or revoked sooner.       Influenza A by PCR NEGATIVE NEGATIVE Final   Influenza B by PCR NEGATIVE NEGATIVE Final    Comment: (NOTE) The Xpert Xpress SARS-CoV-2/FLU/RSV plus assay is intended as an aid in the diagnosis of influenza from Nasopharyngeal swab specimens and should not be used as a sole basis for treatment. Nasal washings and aspirates are unacceptable for Xpert Xpress SARS-CoV-2/FLU/RSV testing.  Fact Sheet for Patients: EntrepreneurPulse.com.au  Fact Sheet for Healthcare Providers: IncredibleEmployment.be  This test is not yet approved or cleared by the [place] FDA and has been authorized for detection and/or diagnosis of SARS-CoV-2 by FDA under an Emergency Use Authorization (EUA). This EUA will remain in effect (meaning this test can be used) for the duration of  the COVID-19 declaration under Section 564(b)(1) of the Act, 21 U.S.C. section 360bbb-3(b)(1), unless the authorization is terminated or revoked.  Performed at [facility], [address] [place], [place] [address]   MRSA Next Gen by PCR, Nasal     Status: None   Collection Time: 01/20/21  5:03 PM   Specimen: Nasal Mucosa; Nasal Swab  Result Value Ref Range Status   MRSA by PCR Next Gen NOT DETECTED NOT DETECTED Final    Comment: (NOTE) The GeneXpert MRSA Assay (FDA approved for NASAL specimens only), is one component of a comprehensive MRSA colonization surveillance program. It is not intended to diagnose MRSA infection nor to guide or monitor treatment for MRSA infections. Test performance is not FDA approved in patients less than 13 years old. Performed at [facility], [address], [place], [place] [address]          Radiology Studies: [facility] Chest [place] 1 View  Result Date: 01/22/2021 CLINICAL DATA:  Shortness of breath EXAM: PORTABLE CHEST 1 VIEW COMPARISON:  January 20, 2021 FINDINGS: Left central line is stable. The ETT is been removed. No pneumothorax. Bilateral layering effusions with underlying atelectasis or not seen previously. The NG tube is been removed in the interval. The cardiomediastinal silhouette is stable. No other changes. IMPRESSION: 1. Support apparatus as above. 2. Bilateral layering pleural effusions with underlying opacities, likely atelectasis, not seen previously. 3. No other acute abnormalities. Electronically Signed   By: Dorise Bullion  III M.D.   On: 01/22/2021 20:44        Scheduled Meds:  chlorhexidine gluconate (MEDLINE KIT)  15 mL Mouth Rinse BID   feeding supplement  237 mL Oral BID BM   metoprolol tartrate  12.5 mg Oral Q6H   Continuous Infusions:  sodium chloride Stopped (01/23/21 1244)    ceFAZolin (ANCEF) IV 2 g (01/24/21 1100)   lactated ringers 50 mL/hr at 01/23/21 2104   potassium PHOSPHATE IVPB (in mmol)  20 mmol (01/24/21 1200)     LOS: 4 days    Time spent: 28 minutes    Sharen Hones, MD [facility]   To contact the attending provider between 7A-7P or the covering provider during after hours 7P-7A, please log into the web site [email or web address] and access using universal [place] password for that web site. If you do not have the password, please call the hospital operator.  01/24/2021, 4:00 PM

## 2021-01-24 NOTE — Consult Note (Signed)
[facility] Face-to-Face Psychiatry Consult   Reason for Consult:  Capacity Referring Physician:  Dr Roosevelt Locks Patient Identification: Lisa Gamble MRN:  [id number] Principal Diagnosis:  Sepsis Diagnosis:  Active Problems:   Acute reaction to situational stress   Septic shock (HCC)   SVT (supraventricular tachycardia) (HCC)   AKI (acute kidney injury) ([place])   Increased anion gap metabolic acidosis   Postoperative respiratory failure (HCC)   Endotracheally intubated   Hyperglycemia   Acute metabolic encephalopathy   Obstruction of left ureteropelvic junction (UPJ) due to stone   Malnutrition of moderate degree   Total Time spent with patient: 45 minutes  Subjective:   Lisa Gamble is a 87 y.o. female patient admitted with sepsis.  HPI:  87 yo female admitted for sepsis from a UTI.  Consult placed for capacity evaluation.  On assessment, her RN was in the room and reported she has had some confusion.  She was alert and oriented to self and "hospital" and 2022.  She was able to tell me she lives alone and has her son available for support.  Asked what she would do if she fell or got hurt and she stated she would call her son, quoted his phone number from memory and checked afterwards for accuracy.  It was correct.  She stated she gets Meals on Wheels and they check on her.  Denies appetite or sleep issues.  No depression or anxiety.  She did ask, "Why are you asking me these questions?"  This provider explained the reason, "I'm fine.  I'm fine to return home."  Client does have capacity to make her own medical decisions.  Once her infection clears she should be back at her baseline with no confusion at times.  PT would need to evaluate her physical capabilities.  Past Psychiatric History: anxiety  Risk to Self:  none Risk to Others:  none Prior Inpatient Therapy:  no psych admissions Prior Outpatient Therapy:  none  Past Medical History:  Past Medical History:  Diagnosis Date    Anxiety    Arthritis    Asthma    HOH (hard of hearing)    Wears dentures    full upper    Past Surgical History:  Procedure Laterality Date   APPENDECTOMY     COLONOSCOPY     CYSTOSCOPY WITH STENT PLACEMENT Left 01/20/2021   Procedure: CYSTOSCOPY WITH STENT PLACEMENT;  Surgeon: Billey Co, MD;  Location: [facility];  Service: Urology;  Laterality: Left;   ORIF HIP FRACTURE Left    PTOSIS REPAIR Bilateral 12/31/2017   Procedure: PTOSIS REPAIR RESECT EX;  Surgeon: Karle Starch, MD;  Location: [place];  Service: Ophthalmology;  Laterality: Bilateral;   TOTAL HIP ARTHROPLASTY Left    Family History: History reviewed. No pertinent family history. Family Psychiatric  History: unknown Social History:  Social History   Substance and Sexual Activity  Alcohol Use Not Currently     Social History   Substance and Sexual Activity  Drug Use Not on file    Social History   Socioeconomic History   Marital status: Single    Spouse name: Not on file   Number of children: Not on file   Years of education: Not on file   Highest education level: Not on file  Occupational History   Not on file  Tobacco Use   Smoking status: Never   Smokeless tobacco: Never  Vaping Use   Vaping Use: Never used  Substance and Sexual  Activity   Alcohol use: Not Currently   Drug use: Not on file   Sexual activity: Not on file  Other Topics Concern   Not on file  Social History Narrative   Not on file   Social Determinants of Health   Financial Resource Strain: Not on file  Food Insecurity: Not on file  Transportation Needs: Not on file  Physical Activity: Not on file  Stress: Not on file  Social Connections: Not on file   Additional Social History:    Allergies:   Allergies  Allergen Reactions   Amitriptyline     Other reaction(s): Unknown Onset 01/06/1999.   Codeine     Doesn't remember reaction   Lithium     Other reaction(s): Unknown Onset 07/24/2009   Tetracycline      Other reaction(s): Unknown Onset 01/06/1999.   Tramadol Diarrhea   Trazodone     Other reaction(s): Unknown Onset 07/25/2009.   Venlafaxine     Other reaction(s): Unknown Onset 01/06/1999.    Labs:  Results for orders placed or performed during the hospital encounter of 01/20/21 (from the past 48 hour(s))  Glucose, capillary     Status: None   Collection Time: 01/22/21  7:32 PM  Result Value Ref Range   Glucose-Capillary 93 70 - 99 mg/dL    Comment: Glucose reference range applies only to samples taken after fasting for at least 8 hours.  Glucose, capillary     Status: None   Collection Time: 01/22/21 11:39 PM  Result Value Ref Range   Glucose-Capillary 99 70 - 99 mg/dL    Comment: Glucose reference range applies only to samples taken after fasting for at least 8 hours.  Glucose, capillary     Status: None   Collection Time: 01/23/21  4:58 AM  Result Value Ref Range   Glucose-Capillary 79 70 - 99 mg/dL    Comment: Glucose reference range applies only to samples taken after fasting for at least 8 hours.  CBC with Differential/Platelet     Status: Abnormal   Collection Time: 01/23/21  4:59 AM  Result Value Ref Range   WBC 23.8 (H) 4.0 - 10.5 K/uL   RBC 3.50 (L) 3.87 - 5.11 MIL/uL   Hemoglobin 10.7 (L) 12.0 - 15.0 g/dL   HCT 32.0 (L) 36.0 - 46.0 %   MCV 91.4 80.0 - 100.0 fL   MCH 30.6 26.0 - 34.0 pg   MCHC 33.4 30.0 - 36.0 g/dL   RDW 13.8 11.5 - 15.5 %   Platelets 53 (L) 150 - 400 K/uL    Comment: Immature Platelet Fraction may be clinically indicated, consider ordering this additional test [id number]    nRBC 0.0 0.0 - 0.2 %   Neutrophils Relative % 93 %   Neutro Abs 22.1 (H) 1.7 - 7.7 K/uL   Lymphocytes Relative 4 %   Lymphs Abs 0.9 0.7 - 4.0 K/uL   Monocytes Relative 2 %   Monocytes Absolute 0.6 0.1 - 1.0 K/uL   Eosinophils Relative 0 %   Eosinophils Absolute 0.0 0.0 - 0.5 K/uL   Basophils Relative 0 %   Basophils Absolute 0.1 0.0 - 0.1 K/uL   WBC Morphology MILD  LEFT SHIFT (1-5% METAS, OCC MYELO, OCC BANDS)    RBC Morphology MORPHOLOGY UNREMARKABLE    Smear Review Normal platelet morphology    Immature Granulocytes 1 %   Abs Immature Granulocytes 0.15 (H) 0.00 - 0.07 K/uL    Comment: Performed at [facility], [address] [place], [place] [address]  Comprehensive metabolic panel     Status: Abnormal   Collection Time: 01/23/21  4:59 AM  Result Value Ref Range   Sodium 137 135 - 145 mmol/L   Potassium 3.0 (L) 3.5 - 5.1 mmol/L   Chloride 102 98 - 111 mmol/L   CO2 28 22 - 32 mmol/L   Glucose, Bld 78 70 - 99 mg/dL    Comment: Glucose reference range applies only to samples taken after fasting for at least 8 hours.   BUN 23 8 - 23 mg/dL   Creatinine, Ser 0.83 0.44 - 1.00 mg/dL   Calcium 6.9 (L) 8.9 - 10.3 mg/dL   Total Protein 4.2 (L) 6.5 - 8.1 g/dL   Albumin 2.3 (L) 3.5 - 5.0 g/dL   AST 23 15 - 41 U/L   ALT 16 0 - 44 U/L   Alkaline Phosphatase 74 38 - 126 U/L   Total Bilirubin 1.1 0.3 - 1.2 mg/dL   GFR, Estimated >60 >60 mL/min    Comment: (NOTE) Calculated using the CKD-EPI Creatinine Equation (2021)    Anion gap 7 5 - 15    Comment: Performed at [facility], [address] [place], [place] [address]  Magnesium     Status: None   Collection Time: 01/23/21  4:59 AM  Result Value Ref Range   Magnesium 1.9 1.7 - 2.4 mg/dL    Comment: Performed at [facility], [address], [place], [place] [address]  Phosphorus     Status: Abnormal   Collection Time: 01/23/21  4:59 AM  Result Value Ref Range   Phosphorus 2.4 (L) 2.5 - 4.6 mg/dL    Comment: Performed at [facility], [address] [place], [place] [address]  Thyroid Panel With TSH     Status: None   Collection Time: 01/23/21  4:59 AM  Result Value Ref Range   TSH 1.970 0.450 - 4.500 uIU/mL   T4, Total 6.1 4.5 - 12.0 ug/dL   T3 Uptake Ratio 33 24 - 39 %   Free Thyroxine Index 2.0 1.2 - 4.9    Comment: (NOTE) Performed At: [facility]  [place], [place] [id number] Rush Farmer MD GY:[id number]   Glucose, capillary     Status: None   Collection Time: 01/23/21  8:03 AM  Result Value Ref Range   Glucose-Capillary 77 70 - 99 mg/dL    Comment: Glucose reference range applies only to samples taken after fasting for at least 8 hours.  Magnesium     Status: None   Collection Time: 01/24/21  4:23 AM  Result Value Ref Range   Magnesium 1.9 1.7 - 2.4 mg/dL    Comment: Performed at [facility], [address] [place], [place] [address]  Phosphorus     Status: Abnormal   Collection Time: 01/24/21  4:23 AM  Result Value Ref Range   Phosphorus 2.1 (L) 2.5 - 4.6 mg/dL    Comment: Performed at [facility], [address] [place], [place] [address]  Basic metabolic panel     Status: Abnormal   Collection Time: 01/24/21  4:23 AM  Result Value Ref Range   Sodium 141 135 - 145 mmol/L   Potassium 3.0 (L) 3.5 - 5.1 mmol/L   Chloride 102 98 - 111 mmol/L   CO2 29 22 - 32 mmol/L   Glucose, Bld 97 70 - 99 mg/dL    Comment: Glucose reference range applies only  to samples taken after fasting for at least 8 hours.   BUN 26 (H) 8 - 23 mg/dL   Creatinine, Ser 0.93 0.44 - 1.00 mg/dL   Calcium 7.5 (L) 8.9 - 10.3 mg/dL   GFR, Estimated 57 (L) >60 mL/min    Comment: (NOTE) Calculated using the CKD-EPI Creatinine Equation (2021)    Anion gap 10 5 - 15    Comment: Performed at [facility], [address] [place], [place] [address]  CBC with Differential/Platelet     Status: Abnormal   Collection Time: 01/24/21  4:23 AM  Result Value Ref Range   WBC 19.6 (H) 4.0 - 10.5 K/uL   RBC 3.59 (L) 3.87 - 5.11 MIL/uL   Hemoglobin 10.9 (L) 12.0 - 15.0 g/dL   HCT 33.8 (L) 36.0 - 46.0 %   MCV 94.2 80.0 - 100.0 fL   MCH 30.4 26.0 - 34.0 pg   MCHC 32.2 30.0 - 36.0 g/dL   RDW 13.8 11.5 - 15.5 %   Platelets 65 (L) 150 - 400 K/uL    Comment: Immature Platelet Fraction may be clinically  indicated, consider ordering this additional test [id number]    nRBC 0.0 0.0 - 0.2 %   Neutrophils Relative % 90 %   Neutro Abs 17.6 (H) 1.7 - 7.7 K/uL   Lymphocytes Relative 6 %   Lymphs Abs 1.1 0.7 - 4.0 K/uL   Monocytes Relative 4 %   Monocytes Absolute 0.8 0.1 - 1.0 K/uL   Eosinophils Relative 0 %   Eosinophils Absolute 0.0 0.0 - 0.5 K/uL   Basophils Relative 0 %   Basophils Absolute 0.1 0.0 - 0.1 K/uL   Immature Granulocytes 0 %   Abs Immature Granulocytes 0.07 0.00 - 0.07 K/uL    Comment: Performed at [facility], [address], [place], [place] [address]    Current Facility-Administered Medications  Medication Dose Route Frequency Provider Last Rate Last Admin   0.9 %  sodium chloride infusion  250 mL Intravenous Continuous Bradly Bienenstock, NP   Stopped at 01/23/21 1244   acetaminophen (TYLENOL) tablet 650 mg  650 mg Oral Q4H PRN Bradly Bienenstock, NP   650 mg at 01/22/21 1459   ALPRAZolam (XANAX) tablet 0.25 mg  0.25 mg Oral BID PRN Flora Lipps, MD   0.25 mg at 01/23/21 1959   bisacodyl (DULCOLAX) suppository 10 mg  10 mg Rectal Daily PRN Flora Lipps, MD       ceFAZolin (ANCEF) IVPB 2g/100 mL premix  2 g Intravenous Q12H Sharen Hones, MD 200 mL/hr at 01/24/21 1100 2 g at 01/24/21 1100   chlorhexidine gluconate (MEDLINE KIT) (PERIDEX) 0.12 % solution 15 mL  15 mL Mouth Rinse BID Flora Lipps, MD   15 mL at 01/22/21 1933   docusate sodium (COLACE) capsule 100 mg  100 mg Oral BID PRN Bradly Bienenstock, NP       feeding supplement (ENSURE ENLIVE / ENSURE PLUS) liquid 237 mL  237 mL Oral BID BM Sharen Hones, MD   237 mL at 01/24/21 1432   haloperidol lactate (HALDOL) injection 1 mg  1 mg Intravenous Q6H PRN Sharen Hones, MD       ipratropium-albuterol (DUONEB) 0.5-2.5 (3) MG/3ML nebulizer solution 3 mL  3 mL Nebulization Q4H PRN Bradly Bienenstock, NP       lactated ringers infusion   Intravenous Continuous Sharen Hones, MD 50 mL/hr at 01/23/21 2104 New Bag at 01/23/21  2104   metoprolol  tartrate (LOPRESSOR) injection 5 mg  5 mg Intravenous Q6H PRN Rust-Chester, Britton L, NP   5 mg at 01/24/21 0623   metoprolol tartrate (LOPRESSOR) tablet 12.5 mg  12.5 mg Oral Q6H Rust-Chester, Britton L, NP   12.5 mg at 01/24/21 1258   ondansetron (ZOFRAN) injection 4 mg  4 mg Intravenous Q6H PRN Darel Hong D, NP   4 mg at 01/22/21 2347   polyethylene glycol (MIRALAX / GLYCOLAX) packet 17 g  17 g Oral Daily PRN Darel Hong D, NP       potassium PHOSPHATE 20 mmol in dextrose 5 % 500 mL infusion  20 mmol Intravenous Once Sharen Hones, MD 84 mL/hr at 01/24/21 1200 20 mmol at 01/24/21 1200   simethicone (MYLICON) chewable tablet 80 mg  80 mg Oral Q6H PRN Rust-Chester, Huel Cote, NP   80 mg at 01/22/21 2054    Musculoskeletal: Strength & Muscle Tone: decreased Gait & Station:  did not witness Patient leans: N/A  Psychiatric Specialty Exam: Physical Exam Vitals and nursing note reviewed.  Constitutional:      Appearance: Normal appearance.  HENT:     Head: Normocephalic.     Nose: Nose normal.  Pulmonary:     Effort: Pulmonary effort is normal.  Musculoskeletal:     Cervical back: Normal range of motion.  Skin:    General: Skin is warm and dry.  Neurological:     General: No focal deficit present.     Mental Status: She is alert.  Psychiatric:        Attention and Perception: Attention and perception normal.        Mood and Affect: Mood and affect normal.        Speech: Speech normal.        Behavior: Behavior normal. Behavior is cooperative.        Thought Content: Thought content normal.        Cognition and Memory: Cognition normal.        Judgment: Judgment normal.    Review of Systems  All other systems reviewed and are negative.  Blood pressure (!) 143/84, pulse 68, temperature 99.2 F (37.3 C), temperature source Oral, resp. rate 15, height '5\' 3"'  (1.6 m), weight 59.9 kg, SpO2 93 %.Body mass index is 23.39 kg/m.  General Appearance: Casual   Eye Contact:  Good  Speech:  Normal Rate  Volume:  Normal  Mood:  Euthymic  Affect:  Congruent  Thought Process:  Coherent and Goal Directed  Orientation:  Other:  person and place  Thought Content:  Logical  Suicidal Thoughts:  No  Homicidal Thoughts:  No  Memory:  Immediate;   Fair Recent;   Fair Remote;   Fair  Judgement:  Good  Insight:  Good  Psychomotor Activity:  Decreased  Concentration:  Concentration: Good and Attention Span: Good  Recall:  [place] of Knowledge:  Good  Language:  Good  Akathisia:  No  Handed:  Right  AIMS (if indicated):     Assets:  Housing Leisure Time Resilience Social Support  ADL's:  usually independent, not evaluated by this provider  Cognition:  WNL  Sleep:        Physical Exam: Physical Exam Vitals and nursing note reviewed.  Constitutional:      Appearance: Normal appearance.  HENT:     Head: Normocephalic.     Nose: Nose normal.  Pulmonary:     Effort: Pulmonary effort is normal.  Musculoskeletal:  Cervical back: Normal range of motion.  Skin:    General: Skin is warm and dry.  Neurological:     General: No focal deficit present.     Mental Status: She is alert.  Psychiatric:        Attention and Perception: Attention and perception normal.        Mood and Affect: Mood and affect normal.        Speech: Speech normal.        Behavior: Behavior normal. Behavior is cooperative.        Thought Content: Thought content normal.        Cognition and Memory: Cognition normal.        Judgment: Judgment normal.   Review of Systems  All other systems reviewed and are negative. Blood pressure (!) 143/84, pulse 68, temperature 99.2 F (37.3 C), temperature source Oral, resp. rate 15, height '5\' 3"'  (1.6 m), weight 59.9 kg, SpO2 93 %. Body mass index is 23.39 kg/m.  Treatment Plan Summary: 87 yo female admitted for sepsis for a UTI.  She is clearing cognitively as her infection diminishes.  Today, she has capacity to make  her own decisions regarding her medical care.  Disposition: No evidence of imminent risk to self or others at present.   Patient does not meet criteria for psychiatric inpatient admission.  Waylan Boga, NP 01/24/2021 5:10 PM

## 2021-01-24 NOTE — Progress Notes (Signed)
Physical Therapy Treatment Patient Details Name: Lisa Gamble MRN: [id number] DOB: 1925-07-22 Today's Date: 01/24/2021    History of Present Illness Patient is a  87 year old female with past medical history of only asthma, anxiety and arthritis who present to the emergency room fever, altered mental status.  Significant leukocytosis, tachycardia.  She developed hypotension. She is diagnosed with septic shock due to UTI.  CT scan showed left-sided 8 mm ureteral stone with hydronephrosis. She had a ureter stent placed on 8/26.  Postoperatively, patient developed respiratory failure requiring mechanical ventilation and vasopressors.    PT Comments    Patient is agreeable to PT. She is in the bed, bilateral mitts in place. Patient is able to follow commands with extra time, no agitation during session. Patient agreeable to get up to chair. Patient continues to require assistance for bed mobility, transfers, and short distance ambulation. Standing activity tolerance limited by generalized weakness and fatigue with minimal activity. Patient states she wants to go home, however patient would be unsafe to return home at this time. Recommend SNF placement at discharge with continued PT to maximize independence and facilitate return to prior level of function.    Follow Up Recommendations  SNF     Equipment Recommendations   (to be determined at next level of care)    Recommendations for Other Services       Precautions / Restrictions Precautions Precautions: Fall Restrictions Weight Bearing Restrictions: No    Mobility  Bed Mobility Overal bed mobility: Needs Assistance Bed Mobility: Supine to Sit;Sit to Supine     Supine to sit: Mod assist     General bed mobility comments: verbal cues for technique. assistance for LE support and intermittent trunk support provided. no dizziness is reported with upright activity    Transfers Overall transfer level: Needs assistance Equipment  used: None Transfers: Sit to/from Stand Sit to Stand: Max assist         General transfer comment: lifting and lowering assistance provided with standing  Ambulation/Gait Ambulation/Gait assistance: Max assist Gait Distance (Feet): 2 Feet Assistive device: None   Gait velocity: decreased   General Gait Details: patient able to take 4-5 small steps with Max A for weightshifting and steadying assistance. decreased standing activity tolerance   Stairs             Wheelchair Mobility    Modified Rankin (Stroke Patients Only)       Balance Overall balance assessment: Needs assistance Sitting-balance support: Feet supported Sitting balance-Leahy Scale: Fair Sitting balance - Comments: no loss of balance in sitting position. close stand by assistance for safety Postural control: Posterior lean   Standing balance-Leahy Scale: Poor Standing balance comment: Mod A to maintain standing balance with posterior lean                            Cognition Arousal/Alertness: Lethargic (initially lethargic but is alert and able to follow commands with increased time) Behavior During Therapy: Lisa Gamble for tasks assessed/performed Overall Cognitive Status: History of cognitive impairments - at baseline                                 General Comments: patient is able to follow commands wiht extra time. patient needs increased time for processing and following commands      Exercises      General Comments General comments (skin integrity,  edema, etc.): patient had bilateral mitts in place. removed mitts at end of session as patient wanted to feed herself breakfast and was not agitated. alerted nurse      Pertinent Vitals/Pain Pain Assessment: No/denies pain    Home Living                      Prior Function            PT Goals (current goals can now be found in the care plan section) Acute Rehab PT Goals Patient Stated Goal: to go  home PT Goal Formulation: With patient Time For Goal Achievement: 02/06/21 Potential to Achieve Goals: Fair Progress towards PT goals: Progressing toward goals    Frequency    Min 2X/week      PT Plan Current plan remains appropriate    Co-evaluation              AM-PAC PT "6 Clicks" Mobility   Outcome Measure  Help needed turning from your back to your side while in a flat bed without using bedrails?: A Lot Help needed moving from lying on your back to sitting on the side of a flat bed without using bedrails?: A Lot Help needed moving to and from a bed to a chair (including a wheelchair)?: A Lot Help needed standing up from a chair using your arms (e.g., wheelchair or bedside chair)?: A Lot Help needed to walk in Gamble room?: A Lot Help needed climbing 3-5 steps with a railing? : Total 6 Click Score: 11    End of Session   Activity Tolerance: Patient limited by fatigue Patient left: in chair;with chair alarm set;with call bell/phone within reach Nurse Communication: Mobility status;Precautions (alerted nurse that mittens were removed at end of session) PT Visit Diagnosis: Unsteadiness on feet (R26.81);Muscle weakness (generalized) (M62.81);Other abnormalities of gait and mobility (R26.89)     Time: [id number] PT Time Calculation (min) (ACUTE ONLY): 26 min  Charges:  $Therapeutic Activity: 23-37 mins                     Lisa Gamble, PT, MPT    Lisa Gamble 01/24/2021, 9:12 AM

## 2021-01-24 NOTE — Evaluation (Signed)
Clinical/Bedside Swallow Evaluation Patient Details  Name: Lisa Gamble MRN: [id number] Date of Birth: 1926-04-06  Today's Date: 01/24/2021 Time: SLP Start Time (ACUTE ONLY): 0945 SLP Stop Time (ACUTE ONLY): 1015 SLP Time Calculation (min) (ACUTE ONLY): 30 min  Past Medical History:  Past Medical History:  Diagnosis Date   Anxiety    Arthritis    Asthma    HOH (hard of hearing)    Wears dentures    full upper   Past Surgical History:  Past Surgical History:  Procedure Laterality Date   APPENDECTOMY     COLONOSCOPY     CYSTOSCOPY WITH STENT PLACEMENT Left 01/20/2021   Procedure: CYSTOSCOPY WITH STENT PLACEMENT;  Surgeon: Billey Co, MD;  Location: [facility];  Service: Urology;  Laterality: Left;   ORIF HIP FRACTURE Left    PTOSIS REPAIR Bilateral 12/31/2017   Procedure: PTOSIS REPAIR RESECT EX;  Surgeon: Karle Starch, MD;  Location: [place];  Service: Ophthalmology;  Laterality: Bilateral;   TOTAL HIP ARTHROPLASTY Left    HPI:  Per physician H&P "Lisa Gamble is a 87 year old female with past medical history of only asthma, anxiety and arthritis who present to the emergency room fever, altered mental status.  She also had a significant leukocytosis, tachycardia.  She developed hypotension, with a lactic acid level 10.9.  She is diagnosed with septic shock due to urinary tract infection.  CT scan showed left-sided 8 mm ureteral stone with hydronephrosis.  He received IV fluids, broad-spectrum antibiotics.  She had a ureter stent placed on 8/26.  Postoperatively, patient developed respiratory failure requiring mechanical ventilation.  Patient blood culture has grew E. coli, urine culture also positive E. coli.  Patient has been extubated, transferred to hospitalist service on 8/28."   Assessment / Plan / Recommendation Clinical Impression  Pt presents with mild to moderate oral phase dysphagia but no s/s of aspiration during bedside swallow eval today. Pt  tolerated 3 sips of thin by straw, 2 bites of applesauce and one bite of graham cracker with no s/s of aspiration. Pt refused more, stating she wasnt hungry. Vocal quality remained clear and laryngeal elevation appeared adequate. Oral transit delay with solids along with mild to moderate oral residue after the swallow. Pt alternated liquids with the solids without cueing to clear. Nsg reports toleration of meds and diet but poor PO's. Rec continue with Dys 2 diet with thin liquids. St to follow up through Nsg and discharge from services unless further concerns arise. SLP Visit Diagnosis: Dysphagia, oropharyngeal phase (R13.12)    Aspiration Risk  Mild aspiration risk    Diet Recommendation Dysphagia 2 (Fine chop);Thin liquid   Medication Administration: Whole meds with puree Supervision: Staff to assist with self feeding Compensations: Minimize environmental distractions;Slow rate;Small sips/bites Postural Changes: Seated upright at 90 degrees;Remain upright for at least 30 minutes after po intake    Other  Recommendations     Follow up Recommendations Skilled Nursing facility      Frequency and Duration Other (Comment) (f/u x1)          Prognosis: Good for toleration of diet but PO intake likely to be poor        Swallow Study   General Date of Onset: 01/20/21 HPI: Per physician H&P "Lisa Gamble is a 87 year old female with past medical history of only asthma, anxiety and arthritis who present to the emergency room fever, altered mental status.  She also had a significant leukocytosis, tachycardia.  She developed hypotension,  with a lactic acid level 10.9.  She is diagnosed with septic shock due to urinary tract infection.  CT scan showed left-sided 8 mm ureteral stone with hydronephrosis.  He received IV fluids, broad-spectrum antibiotics.  She had a ureter stent placed on 8/26.  Postoperatively, patient developed respiratory failure requiring mechanical ventilation.  Patient  blood culture has grew E. coli, urine culture also positive E. coli.  Patient has been extubated, transferred to hospitalist service on 8/28." Type of Study: Bedside Swallow Evaluation Previous Swallow Assessment: None noted Diet Prior to this Study: Dysphagia 2 (chopped) History of Recent Intubation: Yes Length of Intubations (days): 2 days Date extubated: 01/22/21 Behavior/Cognition: Alert;Cooperative;Pleasant mood;Confused Oral Cavity Assessment: Within Functional Limits Oral Care Completed by SLP: No Oral Cavity - Dentition: Dentures, bottom;Dentures, top Vision:  (Needs assist) Self-Feeding Abilities: Needs assist Patient Positioning: Upright in chair Baseline Vocal Quality: Normal;Low vocal intensity    Oral/Motor/Sensory Function Overall Oral Motor/Sensory Function: Within functional limits   Ice Chips Ice chips: Not tested   Thin Liquid Thin Liquid: Within functional limits Presentation: Straw    Nectar Thick Nectar Thick Liquid: Not tested   Honey Thick     Puree Puree: Within functional limits Presentation: Spoon   Solid     Solid: Impaired Presentation: Self Fed Oral Phase Impairments: Impaired mastication Oral Phase Functional Implications: Prolonged oral transit;Oral residue      Lisa Gamble 01/24/2021,10:21 AM

## 2021-01-25 ENCOUNTER — Inpatient Hospital Stay: Payer: Medicare Other

## 2021-01-25 DIAGNOSIS — Z978 Presence of other specified devices: Secondary | ICD-10-CM

## 2021-01-25 DIAGNOSIS — F430 Acute stress reaction: Secondary | ICD-10-CM

## 2021-01-25 DIAGNOSIS — G9341 Metabolic encephalopathy: Secondary | ICD-10-CM | POA: Diagnosis not present

## 2021-01-25 DIAGNOSIS — R739 Hyperglycemia, unspecified: Secondary | ICD-10-CM

## 2021-01-25 LAB — CULTURE, BLOOD (ROUTINE X 2): Culture: NO GROWTH

## 2021-01-25 LAB — CBC WITH DIFFERENTIAL/PLATELET
Abs Immature Granulocytes: 0.1 10*3/uL — ABNORMAL HIGH (ref 0.00–0.07)
Basophils Absolute: 0 10*3/uL (ref 0.0–0.1)
Basophils Relative: 0 %
Eosinophils Absolute: 0.2 10*3/uL (ref 0.0–0.5)
Eosinophils Relative: 2 %
HCT: 34.5 % — ABNORMAL LOW (ref 36.0–46.0)
Hemoglobin: 11.6 g/dL — ABNORMAL LOW (ref 12.0–15.0)
Immature Granulocytes: 1 %
Lymphocytes Relative: 10 %
Lymphs Abs: 1 10*3/uL (ref 0.7–4.0)
MCH: 31.2 pg (ref 26.0–34.0)
MCHC: 33.6 g/dL (ref 30.0–36.0)
MCV: 92.7 fL (ref 80.0–100.0)
Monocytes Absolute: 1 10*3/uL (ref 0.1–1.0)
Monocytes Relative: 10 %
Neutro Abs: 8.3 10*3/uL — ABNORMAL HIGH (ref 1.7–7.7)
Neutrophils Relative %: 77 %
Platelets: 79 10*3/uL — ABNORMAL LOW (ref 150–400)
RBC: 3.72 MIL/uL — ABNORMAL LOW (ref 3.87–5.11)
RDW: 13.9 % (ref 11.5–15.5)
WBC: 10.6 10*3/uL — ABNORMAL HIGH (ref 4.0–10.5)
nRBC: 0 % (ref 0.0–0.2)

## 2021-01-25 LAB — BASIC METABOLIC PANEL
Anion gap: 7 (ref 5–15)
BUN: 22 mg/dL (ref 8–23)
CO2: 29 mmol/L (ref 22–32)
Calcium: 7.6 mg/dL — ABNORMAL LOW (ref 8.9–10.3)
Chloride: 103 mmol/L (ref 98–111)
Creatinine, Ser: 0.77 mg/dL (ref 0.44–1.00)
GFR, Estimated: >60 mL/min (ref >60)
Glucose, Bld: 120 mg/dL — ABNORMAL HIGH (ref 70–99)
Potassium: 3.7 mmol/L (ref 3.5–5.1)
Sodium: 139 mmol/L (ref 135–145)

## 2021-01-25 LAB — MAGNESIUM: Magnesium: 1.9 mg/dL (ref 1.7–2.4)

## 2021-01-25 LAB — PHOSPHORUS: Phosphorus: 2.8 mg/dL (ref 2.5–4.6)

## 2021-01-25 MED ORDER — CEFAZOLIN SODIUM-DEXTROSE 2-4 GM/100ML-% IV SOLN
2.0000 g | Freq: Three times a day (TID) | INTRAVENOUS | Status: DC
  Administered 2021-01-25 – 2021-01-27 (×6): 2 g via INTRAVENOUS
  Filled 2021-01-25 (×9): qty 100

## 2021-01-25 NOTE — TOC Progression Note (Signed)
Transition of Care [facility]) - Progression Note    Patient Details  Name: SAMAIYAH NUBER MRN: [id number] Date of Birth: [birth year]  Transition of Care [facility]) CM/SW [place], RN Phone Number: 01/25/2021, 4:31 PM  Clinical Narrative:  RNCM discussed patient hospital stay and capacity with family and care team.  Patient's family and care team did not feel patient has capacity.  Psychiatry was re-consulted today, and it was determined that patient does not have capacity.  Recommendations are for patient to be discharged to SNF, family is amenable.  Bed search started.TOC contact information given, TOC to follow to discharge.    Expected Discharge Plan: [place] Barriers to Discharge: Continued Medical Work up  Expected Discharge Plan and Services Expected Discharge Plan: [place] In-house Referral: Clinical Social Work   Post Acute Care Choice: [place] arrangements for the past 2 months: Apartment                 DME Arranged: N/A DME Agency: NA       HH Arranged: NA [place] Agency: NA         Social Determinants of Health (SDOH) Interventions    Readmission Risk Interventions No flowsheet data found.

## 2021-01-25 NOTE — Progress Notes (Signed)
PT Cancellation Note  Patient Details Name: Lisa Gamble MRN: [id number] DOB: August 14, 1925   Cancelled Treatment:    Reason Eval/Treat Not Completed: Patient declined, no reason specified. Despite encouragement, patient continues to refuse PT this pm. Will re-attempt at later date/time.    Hasheem Voland 01/25/2021, 2:42 PM

## 2021-01-25 NOTE — Progress Notes (Signed)
PHARMACY NOTE:  ANTIMICROBIAL RENAL DOSAGE ADJUSTMENT  Current antimicrobial regimen includes a mismatch between antimicrobial dosage and estimated renal function.  As per policy approved by the Pharmacy & Therapeutics and Medical Executive Committees, the antimicrobial dosage will be adjusted accordingly.  Current antimicrobial dosage: cefazolin 2gm IV q12h  Indication: E Coli bacteremia  Renal Function:  Estimated Creatinine Clearance: 36.3 mL/min (by C-G formula based on SCr of 0.77 mg/dL). '[]'$      On intermittent HD, scheduled: '[]'$      On CRRT    Antimicrobial dosage has been changed to:  Cefazolin 2gm IV q8h  Additional comments:   Thank you for allowing pharmacy to be a part of this patient's care.  Doreene Eland, PharmD, BCPS.   Work Cell: [phone] 01/25/2021 8:34 AM

## 2021-01-25 NOTE — Consult Note (Signed)
[place] for Electrolyte Monitoring and Replacement   Recent Labs: Potassium (mmol/L)  Date Value  01/25/2021 3.7  02/07/2013 3.7   Magnesium (mg/dL)  Date Value  01/25/2021 1.9   Calcium (mg/dL)  Date Value  01/25/2021 7.6 (L)   Calcium, Total (mg/dL)  Date Value  02/07/2013 8.5   Albumin (g/dL)  Date Value  01/23/2021 2.3 (L)   Phosphorus (mg/dL)  Date Value  01/25/2021 2.8   Sodium (mmol/L)  Date Value  01/25/2021 139  02/07/2013 141   Corrected Ca: 8.5 mg/dL  Assessment:  87 y.o. year old presented with sepsis, left ureteral stone, AKI requiring sent placement/drainage. Admitted to ICU for vasopressor support. Pharmacy has been consulted for monitoring and replacement of electrolytes as needed. K improved after replacement x 2 days and phos improved after replacement x 1 day. Serum calcium low, however due to low albumin, corrected calcium is 8.5.  MIVF: LR at 50 mL/hr  8/29 Replacement:  20 mEq PO KCl x 2 per NP 10 mEq IV KCl x 4 per NP  8/20 Replacement: 10 mEq x 2 per Dr. Roosevelt Locks K Phos 20 mmol x 1 per Dr. Roosevelt Locks  Goal of Therapy:  Electrolytes WNL  Plan: Patient does not need additional replacement this morning. She has now transferred out of the CCU, so will discontinue CCM pharmacy monitoring consult at this time.    Wynelle Cleveland, PharmD Pharmacy Resident  01/25/2021 6:36 AM

## 2021-01-25 NOTE — NC FL2 (Signed)
[place] LEVEL OF CARE SCREENING TOOL     IDENTIFICATION  Patient Name: Lisa Gamble Birthdate: [birth year] Sex: female Admission Date (Current Location): 01/20/2021  [facility] and [place] Number:  Engineering geologist and Address:  [facility], [address] [place], [place] [address]      Provider Number: [id number]  Attending Physician Name and Address:  Nolberto Hanlon, MD  Relative Name and Phone Number:  Ganser,Harold (Son)   [phone] [facility])    Current Level of Care: Hospital Recommended Level of Care: [place] Prior Approval Number:    Date Approved/Denied:   PASRR Number: [id number]  Discharge Plan: SNF    Current Diagnoses: Patient Active Problem List   Diagnosis Date Noted   Malnutrition of moderate degree 01/24/2021   Acute reaction to situational stress 01/24/2021   Altered mental status    SVT (supraventricular tachycardia) ([place]) 01/23/2021   AKI (acute kidney injury) ([place]) 01/23/2021   Increased anion gap metabolic acidosis 93/90/3009   Postoperative respiratory failure ([place]) 01/23/2021   Endotracheally intubated 01/23/2021   Hyperglycemia 23/30/0762   Acute metabolic encephalopathy 26/33/3545   Obstruction of left ureteropelvic junction (UPJ) due to stone 01/23/2021   Septic shock ([place]) 01/20/2021   RAD (reactive airway disease), mild persistent, uncomplicated 62/56/3893   Hyperlipidemia, mixed 07/03/2016   Primary osteoarthritis of both knees 03/21/2015    Orientation RESPIRATION BLADDER Height & Weight     Self  Normal Incontinent Weight: 59.9 kg Height:  _0  (160 cm)  BEHAVIORAL SYMPTOMS/MOOD NEUROLOGICAL BOWEL NUTRITION STATUS     (forgetful, confused at times) Incontinent Diet (dysphagia 2)  AMBULATORY STATUS COMMUNICATION OF NEEDS Skin   Extensive Assist (ambulated 2 feet, has been +2 assist) Verbally Bruising (generalized ecchymosis)                        Personal Care Assistance Level of Assistance  Bathing, Feeding, Dressing Bathing Assistance: Maximum assistance Feeding assistance: Limited assistance Dressing Assistance: Maximum assistance     Functional Limitations Info  Sight, Hearing, Speech Sight Info: Adequate Hearing Info: Adequate Speech Info: Adequate    SPECIAL CARE FACTORS FREQUENCY  PT (By licensed PT), OT (By licensed OT)     PT Frequency: 5x weekly OT Frequency: 5x weekly            Contractures Contractures Info: Not present    Additional Factors Info  Code Status, Allergies Code Status Info: DNR Allergies Info: Amitriptyline  Codeine   Lithium   Tetracycline .  Tramadol    Trazodone  Venlafaxine           Current Medications (01/25/2021):  This is the current hospital active medication list Current Facility-Administered Medications  Medication Dose Route Frequency Provider Last Rate Last Admin   0.9 %  sodium chloride infusion  250 mL Intravenous Continuous Bradly Bienenstock, NP   Stopped at 01/23/21 1244   acetaminophen (TYLENOL) tablet 650 mg  650 mg Oral Q4H PRN Bradly Bienenstock, NP   650 mg at 01/22/21 1459   ALPRAZolam Duanne Moron) tablet 0.25 mg  0.25 mg Oral BID PRN Flora Lipps, MD   0.25 mg at 01/23/21 1959   bisacodyl (DULCOLAX) suppository 10 mg  10 mg Rectal Daily PRN Flora Lipps, MD       ceFAZolin (ANCEF) IVPB 2g/100 mL premix  2 g Intravenous Q8H Berton Mount, RPH 200 mL/hr at 01/25/21 1237 2 g at 01/25/21 1237  chlorhexidine gluconate (MEDLINE KIT) (PERIDEX) 0.12 % solution 15 mL  15 mL Mouth Rinse BID Flora Lipps, MD   15 mL at 01/25/21 1237   docusate sodium (COLACE) capsule 100 mg  100 mg Oral BID PRN Bradly Bienenstock, NP       feeding supplement (ENSURE ENLIVE / ENSURE PLUS) liquid 237 mL  237 mL Oral BID BM Sharen Hones, MD   237 mL at 01/25/21 1238   haloperidol lactate (HALDOL) injection 1 mg  1 mg Intravenous Q6H PRN Sharen Hones, MD       ipratropium-albuterol (DUONEB)  0.5-2.5 (3) MG/3ML nebulizer solution 3 mL  3 mL Nebulization Q4H PRN Bradly Bienenstock, NP       lactated ringers infusion   Intravenous Continuous Sharen Hones, MD 50 mL/hr at 01/23/21 2104 New Bag at 01/23/21 2104   metoprolol tartrate (LOPRESSOR) injection 5 mg  5 mg Intravenous Q6H PRN Rust-Chester, Britton L, NP   5 mg at 01/25/21 0636   metoprolol tartrate (LOPRESSOR) tablet 12.5 mg  12.5 mg Oral Q6H Rust-Chester, Britton L, NP   12.5 mg at 01/25/21 1238   ondansetron (ZOFRAN) injection 4 mg  4 mg Intravenous Q6H PRN Darel Hong D, NP   4 mg at 01/22/21 2347   polyethylene glycol (MIRALAX / GLYCOLAX) packet 17 g  17 g Oral Daily PRN Bradly Bienenstock, NP       simethicone [facility]) chewable tablet 80 mg  80 mg Oral Q6H PRN Rust-Chester, Huel Cote, NP   80 mg at 01/22/21 2054     Discharge Medications: Please see discharge summary for a list of discharge medications.  Relevant Imaging Results:  Relevant Lab Results:   Additional Information SSN [place] Keevan Wolz, [place]

## 2021-01-25 NOTE — Consult Note (Signed)
[facility] Face-to-Face Psychiatry Consult   Reason for Consult:  Capacity to decide home vs. SNF Referring Physician:  Dr. Nolberto Hanlon Patient Identification: Lisa Gamble MRN:  [id number] Principal Diagnosis: <principal problem not specified> Diagnosis:  Active Problems:   Septic shock ([place])   SVT (supraventricular tachycardia) ([place])   AKI (acute kidney injury) ([place])   Increased anion gap metabolic acidosis   Postoperative respiratory failure ([place])   Endotracheally intubated   Hyperglycemia   Acute metabolic encephalopathy   Obstruction of left ureteropelvic junction (UPJ) due to stone   Malnutrition of moderate degree   Acute reaction to situational stress   Altered mental status   Total Time spent with patient: 45 minutes  CC "My back hurts."  Subjective:   Lisa Gamble is a 87 y.o. female patient admitted with sepsis and altered mental status.  HPI:  87 yo female admitted for sepsis from a UTI.  Consult placed for capacity evaluation to determine home vs. SNF. Patient was seen by psychiatric nurse practitioner yesterday evening. At that time patient was fully oriented and able to articulate a safe plan to return home. Team was re-consulted today due to concerns from patient's family, and also due to nursing report of waxing cognition this morning. Patient was seen one-on-one at bedside today. At time of interview she is oriented to name, year, and location in the hospital. She is unable to tell me why she is in the hospital. She is also unable to tell me what the recommendations from her team were. When reminded that team was recommending skilled nursing at discharge, she is only able to say she does not want to go. She is unable to tell me why she does not wish to go. She can not tell me any risks or benefits of going to SNF. She is similarly unable to tell me any benefits or risks of returning to her home. Of note, she is unable to give me her home address or home phone number  today. We completed a MoCA, see below. She scored a 6/30. She showed deficits in all areas. Given the drastic change in orientation from yesterday evening to this afternoon, I believe the patient is experiecing delirium in addition to baseline cognitive deficits.      Spoke with patient's family at [phone]. They note that patient has been declining for the past year, and they have also noted a fluctuation in her cognition. They express extreme concern about her ability to care for herself. On most recent visits to her home they found multiple dirty depends, and majority of her cloths smelled like urine. They note she is not staying hydrated, and subsists on one meal per day from meals on wheels along with cookies and pringle's. They express a desire for her to go to SNF at discharge.   Past Psychiatric History: See previous  Risk to Self:   Risk to Others:   Prior Inpatient Therapy:   Prior Outpatient Therapy:    Past Medical History:  Past Medical History:  Diagnosis Date   Anxiety    Arthritis    Asthma    HOH (hard of hearing)    Wears dentures    full upper    Past Surgical History:  Procedure Laterality Date   APPENDECTOMY     COLONOSCOPY     CYSTOSCOPY WITH STENT PLACEMENT Left 01/20/2021   Procedure: CYSTOSCOPY WITH STENT PLACEMENT;  Surgeon: Billey Co, MD;  Location: [facility];  Service: Urology;  Laterality: Left;   ORIF HIP FRACTURE Left    PTOSIS REPAIR Bilateral 12/31/2017   Procedure: PTOSIS REPAIR RESECT EX;  Surgeon: Karle Starch, MD;  Location: [place];  Service: Ophthalmology;  Laterality: Bilateral;   TOTAL HIP ARTHROPLASTY Left    Family History: History reviewed. No pertinent family history. Family Psychiatric  History: See previous Social History:  Social History   Substance and Sexual Activity  Alcohol Use Not Currently     Social History   Substance and Sexual Activity  Drug Use Not on file    Social History   Socioeconomic  History   Marital status: Single    Spouse name: Not on file   Number of children: Not on file   Years of education: Not on file   Highest education level: Not on file  Occupational History   Not on file  Tobacco Use   Smoking status: Never   Smokeless tobacco: Never  Vaping Use   Vaping Use: Never used  Substance and Sexual Activity   Alcohol use: Not Currently   Drug use: Not on file   Sexual activity: Not on file  Other Topics Concern   Not on file  Social History Narrative   Not on file   Social Determinants of Health   Financial Resource Strain: Not on file  Food Insecurity: Not on file  Transportation Needs: Not on file  Physical Activity: Not on file  Stress: Not on file  Social Connections: Not on file   Additional Social History:    Allergies:   Allergies  Allergen Reactions   Amitriptyline     Other reaction(s): Unknown Onset 01/06/1999.   Codeine     Doesn't remember reaction   Lithium     Other reaction(s): Unknown Onset 07/24/2009   Tetracycline     Other reaction(s): Unknown Onset 01/06/1999.   Tramadol Diarrhea   Trazodone     Other reaction(s): Unknown Onset 07/25/2009.   Venlafaxine     Other reaction(s): Unknown Onset 01/06/1999.    Labs:  Results for orders placed or performed during the hospital encounter of 01/20/21 (from the past 48 hour(s))  Magnesium     Status: None   Collection Time: 01/24/21  4:23 AM  Result Value Ref Range   Magnesium 1.9 1.7 - 2.4 mg/dL    Comment: Performed at [facility], [address] [place], [place] [address]  Phosphorus     Status: Abnormal   Collection Time: 01/24/21  4:23 AM  Result Value Ref Range   Phosphorus 2.1 (L) 2.5 - 4.6 mg/dL    Comment: Performed at [facility], [address] [place], [place] [address]  Basic metabolic panel     Status: Abnormal   Collection Time: 01/24/21  4:23 AM  Result Value Ref Range   Sodium 141 135 - 145 mmol/L   Potassium 3.0 (L) 3.5 -  5.1 mmol/L   Chloride 102 98 - 111 mmol/L   CO2 29 22 - 32 mmol/L   Glucose, Bld 97 70 - 99 mg/dL    Comment: Glucose reference range applies only to samples taken after fasting for at least 8 hours.   BUN 26 (H) 8 - 23 mg/dL   Creatinine, Ser 0.93 0.44 - 1.00 mg/dL   Calcium 7.5 (L) 8.9 - 10.3 mg/dL   GFR, Estimated 57 (L) >60 mL/min    Comment: (NOTE) Calculated using the CKD-EPI Creatinine Equation (2021)    Anion gap 10 5 -  15    Comment: Performed at [facility], [address] [place], [place] [address]  CBC with Differential/Platelet     Status: Abnormal   Collection Time: 01/24/21  4:23 AM  Result Value Ref Range   WBC 19.6 (H) 4.0 - 10.5 K/uL   RBC 3.59 (L) 3.87 - 5.11 MIL/uL   Hemoglobin 10.9 (L) 12.0 - 15.0 g/dL   HCT 33.8 (L) 36.0 - 46.0 %   MCV 94.2 80.0 - 100.0 fL   MCH 30.4 26.0 - 34.0 pg   MCHC 32.2 30.0 - 36.0 g/dL   RDW 13.8 11.5 - 15.5 %   Platelets 65 (L) 150 - 400 K/uL    Comment: Immature Platelet Fraction may be clinically indicated, consider ordering this additional test [id number]    nRBC 0.0 0.0 - 0.2 %   Neutrophils Relative % 90 %   Neutro Abs 17.6 (H) 1.7 - 7.7 K/uL   Lymphocytes Relative 6 %   Lymphs Abs 1.1 0.7 - 4.0 K/uL   Monocytes Relative 4 %   Monocytes Absolute 0.8 0.1 - 1.0 K/uL   Eosinophils Relative 0 %   Eosinophils Absolute 0.0 0.0 - 0.5 K/uL   Basophils Relative 0 %   Basophils Absolute 0.1 0.0 - 0.1 K/uL   Immature Granulocytes 0 %   Abs Immature Granulocytes 0.07 0.00 - 0.07 K/uL    Comment: Performed at [facility], [address] [place], [place] [address]  Magnesium     Status: None   Collection Time: 01/25/21  4:18 AM  Result Value Ref Range   Magnesium 1.9 1.7 - 2.4 mg/dL    Comment: Performed at [facility], [address] [place], [place] [address]  Phosphorus     Status: None   Collection Time: 01/25/21  4:18 AM  Result Value Ref Range   Phosphorus 2.8 2.5 - 4.6 mg/dL     Comment: Performed at [facility], [address] [place], [place] [address]  Basic metabolic panel     Status: Abnormal   Collection Time: 01/25/21  4:18 AM  Result Value Ref Range   Sodium 139 135 - 145 mmol/L   Potassium 3.7 3.5 - 5.1 mmol/L   Chloride 103 98 - 111 mmol/L   CO2 29 22 - 32 mmol/L   Glucose, Bld 120 (H) 70 - 99 mg/dL    Comment: Glucose reference range applies only to samples taken after fasting for at least 8 hours.   BUN 22 8 - 23 mg/dL   Creatinine, Ser 0.77 0.44 - 1.00 mg/dL   Calcium 7.6 (L) 8.9 - 10.3 mg/dL   GFR, Estimated >60 >60 mL/min    Comment: (NOTE) Calculated using the CKD-EPI Creatinine Equation (2021)    Anion gap 7 5 - 15    Comment: Performed at [facility], [address] [place], [place] [address]  CBC with Differential/Platelet     Status: Abnormal   Collection Time: 01/25/21  4:18 AM  Result Value Ref Range   WBC 10.6 (H) 4.0 - 10.5 K/uL   RBC 3.72 (L) 3.87 - 5.11 MIL/uL   Hemoglobin 11.6 (L) 12.0 - 15.0 g/dL   HCT 34.5 (L) 36.0 - 46.0 %   MCV 92.7 80.0 - 100.0 fL   MCH 31.2 26.0 - 34.0 pg   MCHC 33.6 30.0 - 36.0 g/dL   RDW 13.9 11.5 - 15.5 %   Platelets 79 (L) 150 - 400 K/uL  Comment: Immature Platelet Fraction may be clinically indicated, consider ordering this additional test [id number] CONSISTENT WITH PREVIOUS RESULT    nRBC 0.0 0.0 - 0.2 %   Neutrophils Relative % 77 %   Neutro Abs 8.3 (H) 1.7 - 7.7 K/uL   Lymphocytes Relative 10 %   Lymphs Abs 1.0 0.7 - 4.0 K/uL   Monocytes Relative 10 %   Monocytes Absolute 1.0 0.1 - 1.0 K/uL   Eosinophils Relative 2 %   Eosinophils Absolute 0.2 0.0 - 0.5 K/uL   Basophils Relative 0 %   Basophils Absolute 0.0 0.0 - 0.1 K/uL   Immature Granulocytes 1 %   Abs Immature Granulocytes 0.10 (H) 0.00 - 0.07 K/uL    Comment: Performed at [facility], [address] [place], [place] [address]    Current Facility-Administered Medications  Medication Dose  Route Frequency Provider Last Rate Last Admin   0.9 %  sodium chloride infusion  250 mL Intravenous Continuous Bradly Bienenstock, NP   Stopped at 01/23/21 1244   acetaminophen (TYLENOL) tablet 650 mg  650 mg Oral Q4H PRN Bradly Bienenstock, NP   650 mg at 01/22/21 1459   ALPRAZolam (XANAX) tablet 0.25 mg  0.25 mg Oral BID PRN Flora Lipps, MD   0.25 mg at 01/23/21 1959   bisacodyl (DULCOLAX) suppository 10 mg  10 mg Rectal Daily PRN Flora Lipps, MD       ceFAZolin (ANCEF) IVPB 2g/100 mL premix  2 g Intravenous Q8H Zeigler, Sandi Mealy, RPH 200 mL/hr at 01/25/21 1237 2 g at 01/25/21 1237   chlorhexidine gluconate (MEDLINE KIT) (PERIDEX) 0.12 % solution 15 mL  15 mL Mouth Rinse BID Flora Lipps, MD   15 mL at 01/25/21 1237   docusate sodium (COLACE) capsule 100 mg  100 mg Oral BID PRN Bradly Bienenstock, NP       feeding supplement (ENSURE ENLIVE / ENSURE PLUS) liquid 237 mL  237 mL Oral BID BM Sharen Hones, MD   237 mL at 01/25/21 1238   haloperidol lactate (HALDOL) injection 1 mg  1 mg Intravenous Q6H PRN Sharen Hones, MD       ipratropium-albuterol (DUONEB) 0.5-2.5 (3) MG/3ML nebulizer solution 3 mL  3 mL Nebulization Q4H PRN Bradly Bienenstock, NP       lactated ringers infusion   Intravenous Continuous Sharen Hones, MD 50 mL/hr at 01/23/21 2104 New Bag at 01/23/21 2104   metoprolol tartrate (LOPRESSOR) injection 5 mg  5 mg Intravenous Q6H PRN Rust-Chester, Britton L, NP   5 mg at 01/25/21 0636   metoprolol tartrate (LOPRESSOR) tablet 12.5 mg  12.5 mg Oral Q6H Rust-Chester, Britton L, NP   12.5 mg at 01/25/21 1238   ondansetron (ZOFRAN) injection 4 mg  4 mg Intravenous Q6H PRN Darel Hong D, NP   4 mg at 01/22/21 2347   polyethylene glycol (MIRALAX / GLYCOLAX) packet 17 g  17 g Oral Daily PRN Bradly Bienenstock, NP       simethicone (MYLICON) chewable tablet 80 mg  80 mg Oral Q6H PRN Rust-Chester, Huel Cote, NP   80 mg at 01/22/21 2054    Musculoskeletal: Strength & Muscle Tone: decreased Gait &  Station:  Not assessed Patient leans: N/A            Psychiatric Specialty Exam:  Presentation  General Appearance: Appropriate for Environment  Eye Contact:Fair  Speech:Clear and Coherent; Normal Rate  Speech Volume:Normal  Handedness:Right   Mood and Affect  Mood:Euthymic  Affect:Congruent   Thought Process  Thought Processes:Linear  Descriptions of Associations:Loose  Orientation:-- (Oriented to person, year, and hospital. Not oriented to situation, day, or month)  Thought Content:Scattered  History of Schizophrenia/Schizoaffective disorder:No data recorded Duration of Psychotic Symptoms:No data recorded Hallucinations:Hallucinations: None  Ideas of Reference:None  Suicidal Thoughts:Suicidal Thoughts: No  Homicidal Thoughts:Homicidal Thoughts: No   Sensorium  Memory:Immediate Poor; Recent Poor; Remote Poor  Judgment:Impaired  Insight:Shallow   Executive Functions  Concentration:Poor  Attention Span:Poor  Recall:Poor  Fund of Knowledge:Poor  Language:Poor   Psychomotor Activity  Psychomotor Activity:Psychomotor Activity: Decreased   Assets  Assets:Desire for Improvement; Financial Resources/Insurance; Housing; Social Support   Sleep  Sleep:Sleep: Fair   Physical Exam: Physical Exam Vitals and nursing note reviewed.  Constitutional:      Appearance: She is ill-appearing.  HENT:     Head: Normocephalic and atraumatic.     Right Ear: External ear normal.     Left Ear: External ear normal.     Nose: Nose normal.     Mouth/Throat:     Mouth: Mucous membranes are moist.     Pharynx: Oropharynx is clear.  Eyes:     Extraocular Movements: Extraocular movements intact.     Conjunctiva/sclera: Conjunctivae normal.     Pupils: Pupils are equal, round, and reactive to light.  Cardiovascular:     Rate and Rhythm: Tachycardia present.     Pulses: Normal pulses.  Pulmonary:     Effort: Pulmonary effort is normal.     Breath  sounds: Normal breath sounds.  Abdominal:     General: Abdomen is flat.  Musculoskeletal:        General: Swelling present. Normal range of motion.     Cervical back: Normal range of motion and neck supple.  Skin:    General: Skin is warm and dry.  Neurological:     Mental Status: She is alert. She is disoriented.     Motor: Weakness present.  Psychiatric:        Attention and Perception: She is inattentive.        Mood and Affect: Mood is anxious.        Speech: Speech normal.        Behavior: Behavior is cooperative.        Thought Content: Thought content is not paranoid or delusional. Thought content does not include homicidal or suicidal ideation.        Cognition and Memory: Cognition is impaired. Memory is impaired.        Judgment: Judgment is inappropriate.   Review of Systems  Constitutional: Negative.   HENT: Negative.    Eyes: Negative.   Respiratory: Negative.    Cardiovascular: Negative.   Gastrointestinal: Negative.   Genitourinary: Negative.   Musculoskeletal:  Positive for back pain. Negative for myalgias.  Skin: Negative.   Neurological: Negative.   Endo/Heme/Allergies:  Positive for environmental allergies. Does not bruise/bleed easily.  Psychiatric/Behavioral:  Negative for depression, hallucinations, memory loss, substance abuse and suicidal ideas. The patient is not nervous/anxious and does not have insomnia.   Blood pressure (!) 160/92, pulse (!) 107, temperature 99.2 F (37.3 C), resp. rate 16, height '5\' 3"'  (1.6 m), weight 59.9 kg, SpO2 95 %. Body mass index is 23.39 kg/m.  Treatment Plan Summary: 87 year old female admitted for sepsis secondary to UTI and altered mental status. She is experiencing delirium with waxing and waning cognition. Pyschiatry was reconsulted to determine capacity to decide home vs. SNF. At time  of todays interview patient disorient and unable to discuss current plan of care, risks and benefits of going home, or risks and benefits  of going to SNF. At this time patient does not have capacity to determine disposition.   [place]  90[place]13 provides the framework for a provider to determine who has the authority to make the health care decisions for the incapable patient.   Someone holding a valid health care power of attorney (health care agent) to the extent authorized by the power of attorney, unless the court has appointed a guardian for the patient and also suspended authority of the health care agent.  If there is no health care agent as defined in (1), a court-appointed guardian or general guardian.  If there is no guardian as provided in (2), an attorney-in-fact who is granted power over health care decisions by a valid power of attorney.  If there is no attorney-in-fact as provided in (3), the spouse of the patient.  If there is no spouse as provided in (4), a majority of the patient's reasonably available parents and adult children.  If there are no reasonably available parents and adult children as provided in (5), then a 7 of the patient's reasonably available adult siblings.  If there are no reasonably available adult siblings as provided in (6), then an individual who has an established relationship with the patient, who is acting in good faith on behalf of the patient and who can reliably convey the patient's wishes.  If none of the above are available, the patient's attending physician may provide medical treatment to the patient without patient's consent if another physician confirms patient's condition and necessity for medical treatment provided. However, this confirmation by a second physician is not required if delay caused by obtaining confirmation would endanger the patient's life or seriously worsen the patient's condition.  Disposition: Patient does not meet criteria for psychiatric inpatient admission.  Salley Scarlet, MD 01/25/2021 1:52 PM

## 2021-01-25 NOTE — Progress Notes (Signed)
Speech Language Pathology Treatment: Dysphagia  Patient Details Name: Lisa Gamble MRN: [id number] DOB: 06-Oct-1925 Today's Date: 01/25/2021 Time: 1355-1435 SLP Time Calculation (min) (ACUTE ONLY): 40 min  Assessment / Plan / Recommendation Clinical Impression  Pt seen for ongoing toleration of diet; education w/ general aspiration precautions. She continues to present w/ grossly functional oropharyngeal phase swallow function but appears impacted by general decreased awareness and insight of self, environment, and the need for oral intake. Any Cognitive decline impacts overall awareness/engagement w/ po tasks; this can increase risk for choking/aspiration. She required Min+ verbal/visual cues for engagement w/ po tasks but in the end only accepted 2 sips of thin liquids w/ this Clinician and declined all food trials, other liquids.    Pt was educated on the need for better positioning Upright in bed for drinking thin liquids -- she was reclined as she attempted to take sips of liquids via Cup. This Clinician adjusted the bed sitting pt more forward then supported in self-feeding of thin liquids via CUP. No immediate, overt clinical s/s of aspiration noted; no decline in phonations, no cough, and no decline in respiratory status during/post trials. However, coordination of the swallow appeared disorganized and multiple, audible swallows were appreciated x1/2 trials. Oral phase was grossly [facility] for bolus management and oral clearing of the thin liquids. Pt held own Cup to drink. She declined further and all foods offered. NSG reported same.   Discussed diet consistency of foods/thin liquids; aspiration precautions; pills Crushed in puree; self-feeding support needs. Discussed need for pt to try eating/drinking more for health needs in order to D/C safely.    Of note, re: her Cognitive status, pt presents w/ Confusion w/ poor insight into her deficits and the help that she could need if at home  alone. She could NOT problem solve in a Safety situation x2 scenarios nor could she identify how/when to call 911 for an Emergency.  Speech Circumlocution was noted as she attempted to explain Why-/How- questions (not in a Y/N question -- basic y/n questions were adequate).   Handouts posted in room, chart. Recommend a Dysphagia level 2(minced foods for ease) w/ Thin liquids VIA CUP; general aspiration precautions; reduce Distractions during meals. Pills Crushed in Puree for safer swallowing. Support at all meals d/t Cognitive decline. Encouragement to eat/drink. Recommend Dietician f/u. Recommend a skilled setting at D/C or 100% Supervision (live-in) 24/7 if D/C'ing w/ family d/t potential Cognitive decline and safety concerns. Recommend f/u w/ Neurology for formal Cognitive assessment. Noted OT eval, SLUMS results. NSG/MD/Team updated.       HPI HPI: Per physician H&P "Lisa Gamble is a 87 year old female with past medical history of only asthma, anxiety and arthritis who present to the emergency room fever, altered mental status.  She also had a significant leukocytosis, tachycardia.  She developed hypotension, with a lactic acid level 10.9.  She is diagnosed with septic shock due to urinary tract infection.  CT scan showed left-sided 8 mm ureteral stone with hydronephrosis.  He received IV fluids, broad-spectrum antibiotics.  She had a ureter stent placed on 8/26.  Postoperatively, patient developed respiratory failure requiring mechanical ventilation.  Patient blood culture has grew E. coli, urine culture also positive E. coli.  Patient has been extubated, transferred to hospitalist service on 8/28.".   Suspect some degree of Cognitive decline per her presentation -- unsure of her Baseline Cognitive status prior to admit.      SLP Plan  Continue with current plan of care  Recommendations  Diet recommendations: Dysphagia 2 (fine chop);Thin liquid Liquids provided via: Cup;No  straw Medication Administration: Crushed with puree (for safer swallowing) Supervision: Patient able to self feed;Full supervision/cueing for compensatory strategies Compensations: Minimize environmental distractions;Slow rate;Small sips/bites;Follow solids with liquid Postural Changes and/or Swallow Maneuvers: Out of bed for meals;Seated upright 90 degrees;Upright 30-60 min after meal                General recommendations:  (Dietician f/u; Palliative Care f/u for [place]) Oral Care Recommendations: Oral care BID;Oral care before and after PO;Staff/trained caregiver to provide oral care ([place] care) Follow up Recommendations: Skilled Nursing facility SLP Visit Diagnosis: Dysphagia, oropharyngeal phase (R13.12) (suspect Cognitive decline) Plan: Continue with current plan of care       GO                 Orinda Kenner, [place], [place] Pathologist Rehab Services [phone] [facility] 01/25/2021, 4:28 PM

## 2021-01-25 NOTE — Progress Notes (Signed)
Pt son and daughter-in-law express high levels of concern surrounding pt living conditions. Son and daughter-in-law found soiled briefs/depends laying around house and long with stacks of items. Pt reports to them adequate oral intake however. Son states in January he bought a dozen eggs and two cases of water along with other groceries for patient. Son reports when entering the house following pt admission 8 eggs and 1 and 1/2 case water remains uneaten/ not consumed. They feel patient is unsafe to remain in home. The current plan is for SNF and patient as been deemed incompetent to make decisions at this time. Family is concerned if patient is allowed to make decision she will refuse SNF care. Discussed with family options and potential need for POA in future. Explained to that patient could not currently complete POA paperwork d/t declaration of lack of capacity for making decisions. Discussed patient overall plan of care with family, current medications and ongoing monitoring of pt's mental status. Myself and Case Manager, Dreama Saa discussed available resource to patient and family.

## 2021-01-25 NOTE — Progress Notes (Signed)
Transiently PROGRESS NOTE    Lisa Gamble  PPJ:[id number] DOB: [birth year] DOA: 01/20/2021 PCP: [name], MD   Chief complaint.  Altered mental status. Brief Narrative:  Lisa Gamble is a 87 year old female with past medical history of only asthma, anxiety and arthritis who present to the emergency room fever, altered mental status.  She also had a significant leukocytosis, tachycardia.  She developed hypotension, with a lactic acid level 10.9.  She is diagnosed with septic shock due to urinary tract infection.  CT scan showed left-sided 8 mm ureteral stone with hydronephrosis.  He received IV fluids, broad-spectrum antibiotics.  She had a ureter stent placed on 8/26.  Postoperatively, patient developed respiratory failure requiring mechanical ventilation. Patient blood culture has grew E. coli, urine culture also positive E. coli. Patient has been extubated, transferred to hospitalist service on 8/28.  8/31-patient answering me appropriately.  She complains of back pain just laying in bed.  No other issues.  Assessment & Plan:   Active Problems:   Septic shock (HCC)   SVT (supraventricular tachycardia) (HCC)   AKI (acute kidney injury) ([place])   Increased anion gap metabolic acidosis   Postoperative respiratory failure (HCC)   Endotracheally intubated   Hyperglycemia   Acute metabolic encephalopathy   Obstruction of left ureteropelvic junction (UPJ) due to stone   Malnutrition of moderate degree   Acute reaction to situational stress   Altered mental status  #1.  Severe sepsis with septic shock. E. coli septicemia. E. coli pyelonephritis. Left ureteral stone with hydronephrosis.  Status post ureteral stent. Acute hypoxemic respite failure secondary to septic shock. Blood culture finalized with E. coli pansensitive. Patient will be able to treat with oral Cipro for 2 weeks. Patient condition so far had improved, currently she is off oxygen. She has been evaluated by  physical therapy, need nursing home placement.  Patient refused to go to nursing home, will obtain psych consult to determine her capacity to make decisions.  Most likely family will decide to place her in a nursing home. 8/31 psych stated pt has capacity.  Discussed this with patient's family.  They are concerned about patient's living condition since lives by her self. Told them about pscy input today. However, pt does likely need to go to SNF, as she is deconditioned.   2.  Acute kidney injury secondary to septic shock and obstruction. Anion gap metabolic acidosis with severe lactic acidosis. Hypokalemia. Hypophosphatemia. Labs stable. Improved renal function Poor appetiti , encouraged to eat and hydrate Continue ivf  #3.  Acute metabolic encephalopathy. Dementia: Improved MS. Answered questions appropriately for me today.  4.  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 Outpatient work-up. 8/31- will obtain pelvic US  5.  Severe thrombocytopenia. Secondary to septic shock. Patient never had heparin.  Continue to follow.    DVT prophylaxis: SCDs Code Status: DNR Family Communication:  Disposition Plan:    Status is: Inpatient  Remains inpatient appropriate because:IV treatments appropriate due to intensity of illness or inability to take PO and Inpatient level of care appropriate due to severity of illness  Dispo: The patient is from: Home              Anticipated d/c is to: SNF              Patient currently is not medically stable to d/c.   Difficult to place patient No        I/O last 3 completed shifts: In: 1274.2 [P.O.:480; I.V.:694.2; IV Piggyback:100] Out: 5809 [  Urine:1750] No intake/output data recorded.     Consultants:  None  Procedures:   Antimicrobials: Cefazolin.  Subjective: C/o back pain laying in bed. No cp,or sob  Objective: Vitals:   01/24/21 1211 01/24/21 1735 01/24/21 2005 01/25/21 0530  BP: (!) 143/84 (!) 161/89 (!) 166/78 (!) 155/73   Pulse: 68 91 85 84  Resp:  _0 Temp: 99.2 F (37.3 C) 98.3 F (36.8 C) 98.6 F (37 C) 98.7 F (37.1 C)  TempSrc: Oral     SpO2: 93% 96% 94% 92%  Weight:      Height:        Intake/Output Summary (Last 24 hours) at 01/25/2021 0838 Last data filed at 01/25/2021 0535 Gross per 24 hour  Intake 1274.23 ml  Output 1150 ml  Net 124.23 ml   Filed Weights   01/21/21 0318 01/22/21 0500 01/23/21 0500  Weight: 56.2 kg 61.3 kg 59.9 kg    Examination: Nad, calm Cta no w/r Regular s1/s2 no gallop Soft benign +bs No edema aaxoxo3    Data Reviewed: I have personally reviewed following labs and imaging studies  CBC: Recent Labs  Lab 01/21/21 0417 01/22/21 0615 01/23/21 0459 01/24/21 0423 01/25/21 0418  WBC 47.1* 20.4* 23.8* 19.6* 10.6*  NEUTROABS 42.2* 16.5* 22.1* 17.6* 8.3*  HGB 11.8* 10.7* 10.7* 10.9* 11.6*  HCT 35.3* 31.4* 32.0* 33.8* 34.5*  MCV 93.6 90.8 91.4 94.2 92.7  PLT 168 62* 53* 65* 79*   Basic Metabolic Panel: Recent Labs  Lab 01/21/21 0417 01/22/21 0615 01/23/21 0459 01/24/21 0423 01/25/21 0418  NA 137 138 137 141 139  K 3.3* 4.1 3.0* 3.0* 3.7  CL 109 106 102 102 103  CO2 21* _1 GLUCOSE 179* 101* 78 97 120*  BUN 31* 31* 23 26* 22  CREATININE 0.98 0.85 0.83 0.93 0.77  CALCIUM 7.0* 7.1* 6.9* 7.5* 7.6*  MG 2.4 2.0 1.9 1.9 1.9  PHOS 3.3 2.7 2.4* 2.1* 2.8   GFR: Estimated Creatinine Clearance: 36.3 mL/min (by C-G formula based on SCr of 0.77 mg/dL). Liver Function Tests: Recent Labs  Lab 01/20/21 1142 01/21/21 0417 01/22/21 0615 01/23/21 0459  AST 45* 35 26 23  ALT _2 ALKPHOS 60 45 66 74  BILITOT 1.4* 0.9 0.9 1.1  PROT 5.3* 4.1* 4.1* 4.2*  ALBUMIN 3.1* 2.3* 2.1* 2.3*   No results for input(s): LIPASE, AMYLASE in the last 168 hours. No results for input(s): AMMONIA in the last 168 hours. Coagulation Profile: Recent Labs  Lab 01/20/21 1142  INR 1.3*   Cardiac Enzymes: No results for input(s): CKTOTAL, CKMB,  CKMBINDEX, TROPONINI in the last 168 hours. BNP (last 3 results) No results for input(s): PROBNP in the last 8760 hours. HbA1C: No results for input(s): HGBA1C in the last 72 hours. CBG: Recent Labs  Lab 01/22/21 1606 01/22/21 1932 01/22/21 2339 01/23/21 0458 01/23/21 0803  GLUCAP 90 93 99 79 77   Lipid Profile: No results for input(s): CHOL, HDL, LDLCALC, TRIG, CHOLHDL, LDLDIRECT in the last 72 hours. Thyroid Function Tests: Recent Labs    01/23/21 0459  TSH 1.970  T4TOTAL 6.1   Anemia Panel: No results for input(s): VITAMINB12, FOLATE, FERRITIN, TIBC, IRON, RETICCTPCT in the last 72 hours. Sepsis Labs: Recent Labs  Lab 01/20/21 1142 01/20/21 1340 01/20/21 1940 01/20/21 2057 01/21/21 0417 01/21/21 1156 01/22/21 0615  PROCALCITON 58.25  --   --   --  >150.00  --  109.80  LATICACIDVEN  10.9*   < > 5.8* 6.5* 5.6* 5.1*  --    < > = values in this interval not displayed.    Recent Results (from the past 240 hour(s))  Culture, blood (Routine x 2)     Status: None   Collection Time: 01/20/21 11:42 AM   Specimen: BLOOD  Result Value Ref Range Status   Specimen Description BLOOD BLOOD RIGHT FOREARM  Final   Special Requests   Final    BOTTLES DRAWN AEROBIC AND ANAEROBIC Blood Culture results may not be optimal due to an inadequate volume of blood received in culture bottles   Culture   Final    NO GROWTH 5 DAYS Performed at [facility], [address] [place], [place] [address]    Report Status 01/25/2021 FINAL  Final  Culture, blood (Routine x 2)     Status: Abnormal   Collection Time: 01/20/21 11:42 AM   Specimen: BLOOD  Result Value Ref Range Status   Specimen Description   Final    BLOOD LEFT ANTECUBITAL Performed at [facility], [address], [place], [place] [address]    Special Requests   Final    BOTTLES DRAWN AEROBIC AND ANAEROBIC Blood Culture results may not be optimal due to an inadequate volume of blood received in culture  bottles Performed at [facility], [address] [place], [place] [address]    Culture  Setup Time   Final    GRAM NEGATIVE RODS ANAEROBIC BOTTLE ONLY CRITICAL RESULT CALLED TO, READ BACK BY AND VERIFIED WITH: CAROLINE CHILDS AT 1129 01/21/21.PMF Performed at [facility], [place] [address] [place], [place] [address]    Culture ESCHERICHIA COLI (A)  Final   Report Status 01/23/2021 FINAL  Final   Organism ID, Bacteria ESCHERICHIA COLI  Final      Susceptibility   Escherichia coli - MIC*    AMPICILLIN 8 SENSITIVE Sensitive     CEFAZOLIN <=4 SENSITIVE Sensitive     CEFEPIME <=0.12 SENSITIVE Sensitive     CEFTAZIDIME <=1 SENSITIVE Sensitive     CEFTRIAXONE <=0.25 SENSITIVE Sensitive     CIPROFLOXACIN <=0.25 SENSITIVE Sensitive     GENTAMICIN <=1 SENSITIVE Sensitive     IMIPENEM 1 SENSITIVE Sensitive     TRIMETH/SULFA <=20 SENSITIVE Sensitive     AMPICILLIN/SULBACTAM <=2 SENSITIVE Sensitive     PIP/TAZO <=4 SENSITIVE Sensitive     * ESCHERICHIA COLI  Urine Culture     Status: Abnormal   Collection Time: 01/20/21 11:42 AM   Specimen: Urine, Clean Catch  Result Value Ref Range Status   Specimen Description   Final    URINE, CLEAN CATCH Performed at [facility], [address] [place], [place] [address]    Special Requests   Final    NONE Performed at [facility], [address] [place], [place] [address]    Culture >=100,000 COLONIES/mL ESCHERICHIA COLI (A)  Final   Report Status 01/23/2021 FINAL  Final   Organism ID, Bacteria ESCHERICHIA COLI (A)  Final      Susceptibility   Escherichia coli - MIC*    AMPICILLIN <=2 SENSITIVE Sensitive     CEFAZOLIN <=4 SENSITIVE Sensitive     CEFEPIME <=0.12 SENSITIVE Sensitive     CEFTRIAXONE <=0.25 SENSITIVE Sensitive     CIPROFLOXACIN <=0.25 SENSITIVE Sensitive     GENTAMICIN <=1 SENSITIVE Sensitive     IMIPENEM <=0.25 SENSITIVE Sensitive     NITROFURANTOIN <=16 SENSITIVE Sensitive  TRIMETH/SULFA <=20 SENSITIVE Sensitive     AMPICILLIN/SULBACTAM <=2 SENSITIVE Sensitive     PIP/TAZO <=4 SENSITIVE Sensitive     * >=100,000 COLONIES/mL ESCHERICHIA COLI  Blood Culture ID Panel (Reflexed)     Status: Abnormal   Collection Time: 01/20/21 11:42 AM  Result Value Ref Range Status   Enterococcus faecalis NOT DETECTED NOT DETECTED Final   Enterococcus Faecium NOT DETECTED NOT DETECTED Final   Listeria monocytogenes NOT DETECTED NOT DETECTED Final   Staphylococcus species NOT DETECTED NOT DETECTED Final   Staphylococcus aureus (BCID) NOT DETECTED NOT DETECTED Final   Staphylococcus epidermidis NOT DETECTED NOT DETECTED Final   Staphylococcus lugdunensis NOT DETECTED NOT DETECTED Final   Streptococcus species NOT DETECTED NOT DETECTED Final   Streptococcus agalactiae NOT DETECTED NOT DETECTED Final   Streptococcus pneumoniae NOT DETECTED NOT DETECTED Final   Streptococcus pyogenes NOT DETECTED NOT DETECTED Final   A.calcoaceticus-baumannii NOT DETECTED NOT DETECTED Final   Bacteroides fragilis NOT DETECTED NOT DETECTED Final   Enterobacterales DETECTED (A) NOT DETECTED Final    Comment: Enterobacterales represent a large order of gram negative bacteria, not a single organism. CRITICAL RESULT CALLED TO, READ BACK BY AND VERIFIED WITH: CAROLINE CHILDS AT 1129 01/21/21.PMF    Enterobacter cloacae complex NOT DETECTED NOT DETECTED Final   Escherichia coli DETECTED (A) NOT DETECTED Final    Comment: CRITICAL RESULT CALLED TO, READ BACK BY AND VERIFIED WITH: CAROLINE CHILDS AT 1129 01/21/21.PMF    Klebsiella aerogenes NOT DETECTED NOT DETECTED Final   Klebsiella oxytoca NOT DETECTED NOT DETECTED Final   Klebsiella pneumoniae NOT DETECTED NOT DETECTED Final   Proteus species NOT DETECTED NOT DETECTED Final   Salmonella species NOT DETECTED NOT DETECTED Final   Serratia marcescens NOT DETECTED NOT DETECTED Final   Haemophilus influenzae NOT DETECTED NOT DETECTED Final   Neisseria  meningitidis NOT DETECTED NOT DETECTED Final   Pseudomonas aeruginosa NOT DETECTED NOT DETECTED Final   Stenotrophomonas maltophilia NOT DETECTED NOT DETECTED Final   Candida albicans NOT DETECTED NOT DETECTED Final   Candida auris NOT DETECTED NOT DETECTED Final   Candida glabrata NOT DETECTED NOT DETECTED Final   Candida krusei NOT DETECTED NOT DETECTED Final   Candida parapsilosis NOT DETECTED NOT DETECTED Final   Candida tropicalis NOT DETECTED NOT DETECTED Final   Cryptococcus neoformans/gattii NOT DETECTED NOT DETECTED Final   CTX-M ESBL NOT DETECTED NOT DETECTED Final   Carbapenem resistance IMP NOT DETECTED NOT DETECTED Final   Carbapenem resistance KPC NOT DETECTED NOT DETECTED Final   Carbapenem resistance NDM NOT DETECTED NOT DETECTED Final   Carbapenem resist OXA 48 LIKE NOT DETECTED NOT DETECTED Final   Carbapenem resistance VIM NOT DETECTED NOT DETECTED Final    Comment: Performed at [facility], [address] [place], [place] [address]  Resp Panel by RT-PCR (Flu A&B, Covid) Nasopharyngeal Swab     Status: None   Collection Time: 01/20/21 12:21 PM   Specimen: Nasopharyngeal Swab; Nasopharyngeal(NP) swabs in vial transport medium  Result Value Ref Range Status   SARS Coronavirus 2 by RT PCR NEGATIVE NEGATIVE Final    Comment: (NOTE) SARS-CoV-2 target nucleic acids are NOT DETECTED.  The SARS-CoV-2 RNA is generally detectable in upper respiratory specimens during the acute phase of infection. The lowest concentration of SARS-CoV-2 viral copies this assay can detect is 138 copies/mL. A negative result does not preclude SARS-Cov-2 infection and should not be used as the sole basis for treatment or other patient management decisions.  A negative result may occur with  improper specimen collection/handling, submission of specimen other than nasopharyngeal swab, presence of viral mutation(s) within the areas targeted by this assay, and inadequate number of  viral copies(<138 copies/mL). A negative result must be combined with clinical observations, patient history, and epidemiological information. The expected result is Negative.  Fact Sheet for Patients:  EntrepreneurPulse.com.au  Fact Sheet for Healthcare Providers:  IncredibleEmployment.be  This test is no t yet approved or cleared by the [place] FDA and  has been authorized for detection and/or diagnosis of SARS-CoV-2 by FDA under an Emergency Use Authorization (EUA). This EUA will remain  in effect (meaning this test can be used) for the duration of the COVID-19 declaration under Section 564(b)(1) of the Act, 21 U.S.C.section 360bbb-3(b)(1), unless the authorization is terminated  or revoked sooner.       Influenza A by PCR NEGATIVE NEGATIVE Final   Influenza B by PCR NEGATIVE NEGATIVE Final    Comment: (NOTE) The Xpert Xpress SARS-CoV-2/FLU/RSV plus assay is intended as an aid in the diagnosis of influenza from Nasopharyngeal swab specimens and should not be used as a sole basis for treatment. Nasal washings and aspirates are unacceptable for Xpert Xpress SARS-CoV-2/FLU/RSV testing.  Fact Sheet for Patients: EntrepreneurPulse.com.au  Fact Sheet for Healthcare Providers: IncredibleEmployment.be  This test is not yet approved or cleared by the [place] FDA and has been authorized for detection and/or diagnosis of SARS-CoV-2 by FDA under an Emergency Use Authorization (EUA). This EUA will remain in effect (meaning this test can be used) for the duration of the COVID-19 declaration under Section 564(b)(1) of the Act, 21 U.S.C. section 360bbb-3(b)(1), unless the authorization is terminated or revoked.  Performed at [facility], [address] [place], [place] [address]   MRSA Next Gen by PCR, Nasal     Status: None   Collection Time: 01/20/21  5:03 PM   Specimen: Nasal Mucosa;  Nasal Swab  Result Value Ref Range Status   MRSA by PCR Next Gen NOT DETECTED NOT DETECTED Final    Comment: (NOTE) The GeneXpert MRSA Assay (FDA approved for NASAL specimens only), is one component of a comprehensive MRSA colonization surveillance program. It is not intended to diagnose MRSA infection nor to guide or monitor treatment for MRSA infections. Test performance is not FDA approved in patients less than 42 years old. Performed at [facility], [address] [place], [place] [address]          Radiology Studies: No results found.      Scheduled Meds:  chlorhexidine gluconate (MEDLINE KIT)  15 mL Mouth Rinse BID   feeding supplement  237 mL Oral BID BM   metoprolol tartrate  12.5 mg Oral Q6H   Continuous Infusions:  sodium chloride Stopped (01/23/21 1244)    ceFAZolin (ANCEF) IV     lactated ringers 50 mL/hr at 01/23/21 2104     LOS: 5 days    Time spent: 50mnutes with more than 50% on COC    SNolberto Hanlon MD [facility]   To contact the attending provider between 7A-7P or the covering provider during after hours 7P-7A, please log into the web site [email or web address] and access using universal [place] password for that web site. If you do not have the password, please call the hospital operator.  01/25/2021, 8:38 AM

## 2021-01-26 DIAGNOSIS — E872 Acidosis: Secondary | ICD-10-CM

## 2021-01-26 LAB — RESP PANEL BY RT-PCR (FLU A&B, COVID) ARPGX2
Influenza A by PCR: NEGATIVE
Influenza B by PCR: NEGATIVE
SARS Coronavirus 2 by RT PCR: NEGATIVE

## 2021-01-26 LAB — GLUCOSE, CAPILLARY
Glucose-Capillary: 118 mg/dL — ABNORMAL HIGH (ref 70–99)
Glucose-Capillary: 148 mg/dL — ABNORMAL HIGH (ref 70–99)

## 2021-01-26 LAB — MAGNESIUM: Magnesium: 1.8 mg/dL (ref 1.7–2.4)

## 2021-01-26 LAB — PHOSPHORUS: Phosphorus: 2.9 mg/dL (ref 2.5–4.6)

## 2021-01-26 MED ORDER — MEGESTROL ACETATE 20 MG PO TABS
40.0000 mg | ORAL_TABLET | Freq: Every day | ORAL | Status: DC
  Administered 2021-01-26 – 2021-01-27 (×2): 40 mg via ORAL
  Filled 2021-01-26 (×2): qty 2

## 2021-01-26 NOTE — Progress Notes (Signed)
Transiently PROGRESS NOTE    Collen Hostler Hoes  ZOX:[id number] DOB: May 24, 1926 DOA: 01/20/2021 PCP: [name], MD   Chief complaint.  Altered mental status. Brief Narrative:  Lisa Gamble is a 87 year old female with past medical history of only asthma, anxiety and arthritis who present to the emergency room fever, altered mental status.  She also had a significant leukocytosis, tachycardia.  She developed hypotension, with a lactic acid level 10.9.  She is diagnosed with septic shock due to urinary tract infection.  CT scan showed left-sided 8 mm ureteral stone with hydronephrosis.  He received IV fluids, broad-spectrum antibiotics.  She had a ureter stent placed on 8/26.  Postoperatively, patient developed respiratory failure requiring mechanical ventilation. Patient blood culture has grew E. coli, urine culture also positive E. coli. Patient has been extubated, transferred to hospitalist service on 8/28.  8/31-patient answering me appropriately.  She complains of back pain just laying in bed.  No other issues. 9/1 has not much appetite this am. Refuses to eat . SNF bed pending  Assessment & Plan:   Active Problems:   Septic shock (HCC)   SVT (supraventricular tachycardia) (HCC)   AKI (acute kidney injury) ([place])   Increased anion gap metabolic acidosis   Postoperative respiratory failure (HCC)   Endotracheally intubated   Hyperglycemia   Acute metabolic encephalopathy   Obstruction of left ureteropelvic junction (UPJ) due to stone   Malnutrition of moderate degree   Acute reaction to situational stress   Altered mental status  #1.  Severe sepsis with septic shock. E. coli septicemia. E. coli pyelonephritis. Left ureteral stone with hydronephrosis.  Status post ureteral stent. Acute hypoxemic respite failure secondary to septic shock. Blood culture finalized with E. coli pansensitive. Patient will be able to treat with oral Cipro for 2 weeks. Patient condition so far  had improved, currently she is off oxygen. She has been evaluated by physical therapy, need nursing home placement.  Patient refused to go to nursing home, will obtain psych consult to determine her capacity to make decisions.  Most likely family will decide to place her in a nursing home. 8/31 psych stated pt has capacity.  Discussed this with patient's family.  They are concerned about patient's living condition since lives by her self. Told them about pscy input today. However, pt does likely need to go to SNF, as she is deconditioned.  9/1 hemodynamically stable.  Continue cipro for total of 2 weeks.   2.  Acute kidney injury secondary to septic shock and obstruction. Anion gap metabolic acidosis with severe lactic acidosis. Hypokalemia. Hypophosphatemia. Labs stable. Improved renal function 9/1 poor appetite.  Although encouraged her to eat and hydrate she is not doing much. Will add Megace Continue IV fluids  #3.  Acute metabolic encephalopathy. Dementia: Improved MS  Answers questions appropriately for me    4.  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  Severe thrombocytopenia. Secondary to septic shock. Patient never had heparin.  Continue to follow.    DVT prophylaxis: SCDs Code Status: DNR Family Communication:  Disposition Plan:    Status is: Inpatient  Remains inpatient appropriate because:IV treatments appropriate due to intensity of illness or inability to take PO and Inpatient level of care appropriate due to severity of illness  Dispo: The patient is from: Home              Anticipated d/c  is to: SNF              Patient currently is not medically stable to d/c.   Difficult to place patient No        I/O last 3 completed shifts: In: 112.3 [I.V.:12.3; IV Piggyback:100] Out: 1400 [Urine:1400] No intake/output data  recorded.     Consultants:  None  Procedures:   Antimicrobials: Cefazolin.  Subjective: No other complaints of chest pain, shortness of breath, dizziness or lightheadedness  Objective: Vitals:   01/26/21 0107 01/26/21 0538 01/26/21 0600 01/26/21 0804  BP: (!) 159/79 (!) 153/80  (!) 159/68  Pulse: 93 (!) 106  (!) 101  Resp: _0 Temp: 98.2 F (36.8 C) 98.1 F (36.7 C)  98.4 F (36.9 C)  TempSrc: Oral     SpO2: 95% 94%  95%  Weight:   59.9 kg   Height:        Intake/Output Summary (Last 24 hours) at 01/26/2021 0816 Last data filed at 01/26/2021 0558 Gross per 24 hour  Intake 112.25 ml  Output 600 ml  Net -487.75 ml   Filed Weights   01/22/21 0500 01/23/21 0500 01/26/21 0600  Weight: 61.3 kg 59.9 kg 59.9 kg    Examination: Calm, sitting in chair Cta no w/r/r Regular s1/s2 no gallop Soft benign +bs No edema Awake and alert      Data Reviewed: I have personally reviewed following labs and imaging studies  CBC: Recent Labs  Lab 01/21/21 0417 01/22/21 0615 01/23/21 0459 01/24/21 0423 01/25/21 0418  WBC 47.1* 20.4* 23.8* 19.6* 10.6*  NEUTROABS 42.2* 16.5* 22.1* 17.6* 8.3*  HGB 11.8* 10.7* 10.7* 10.9* 11.6*  HCT 35.3* 31.4* 32.0* 33.8* 34.5*  MCV 93.6 90.8 91.4 94.2 92.7  PLT 168 62* 53* 65* 79*   Basic Metabolic Panel: Recent Labs  Lab 01/21/21 0417 01/22/21 0615 01/23/21 0459 01/24/21 0423 01/25/21 0418 01/26/21 0314  NA 137 138 137 141 139  --   K 3.3* 4.1 3.0* 3.0* 3.7  --   CL 109 106 102 102 103  --   CO2 21* _1 --   GLUCOSE 179* 101* 78 97 120*  --   BUN 31* 31* 23 26* 22  --   CREATININE 0.98 0.85 0.83 0.93 0.77  --   CALCIUM 7.0* 7.1* 6.9* 7.5* 7.6*  --   MG 2.4 2.0 1.9 1.9 1.9 1.8  PHOS 3.3 2.7 2.4* 2.1* 2.8 2.9   GFR: Estimated Creatinine Clearance: 36.3 mL/min (by C-G formula based on SCr of 0.77 mg/dL). Liver Function Tests: Recent Labs  Lab 01/20/21 1142 01/21/21 0417 01/22/21 0615 01/23/21 0459  AST  45* 35 26 23  ALT _2 ALKPHOS 60 45 66 74  BILITOT 1.4* 0.9 0.9 1.1  PROT 5.3* 4.1* 4.1* 4.2*  ALBUMIN 3.1* 2.3* 2.1* 2.3*   No results for input(s): LIPASE, AMYLASE in the last 168 hours. No results for input(s): AMMONIA in the last 168 hours. Coagulation Profile: Recent Labs  Lab 01/20/21 1142  INR 1.3*   Cardiac Enzymes: No results for input(s): CKTOTAL, CKMB, CKMBINDEX, TROPONINI in the last 168 hours. BNP (last 3 results) No results for input(s): PROBNP in the last 8760 hours. HbA1C: No results for input(s): HGBA1C in the last 72 hours. CBG: Recent Labs  Lab 01/22/21 1606 01/22/21 1932 01/22/21 2339 01/23/21 0458 01/23/21 0803  GLUCAP 90 93 99 79 77   Lipid Profile: No results for  input(s): CHOL, HDL, LDLCALC, TRIG, CHOLHDL, LDLDIRECT in the last 72 hours. Thyroid Function Tests: No results for input(s): TSH, T4TOTAL, FREET4, T3FREE, THYROIDAB in the last 72 hours.  Anemia Panel: No results for input(s): VITAMINB12, FOLATE, FERRITIN, TIBC, IRON, RETICCTPCT in the last 72 hours. Sepsis Labs: Recent Labs  Lab 01/20/21 1142 01/20/21 1340 01/20/21 1940 01/20/21 2057 01/21/21 0417 01/21/21 1156 01/22/21 0615  PROCALCITON 58.25  --   --   --  >150.00  --  109.80  LATICACIDVEN 10.9*   < > 5.8* 6.5* 5.6* 5.1*  --    < > = values in this interval not displayed.    Recent Results (from the past 240 hour(s))  Culture, blood (Routine x 2)     Status: None   Collection Time: 01/20/21 11:42 AM   Specimen: BLOOD  Result Value Ref Range Status   Specimen Description BLOOD BLOOD RIGHT FOREARM  Final   Special Requests   Final    BOTTLES DRAWN AEROBIC AND ANAEROBIC Blood Culture results may not be optimal due to an inadequate volume of blood received in culture bottles   Culture   Final    NO GROWTH 5 DAYS Performed at [facility], [address] [place], [place] [address]    Report Status 01/25/2021 FINAL  Final  Culture, blood (Routine x 2)      Status: Abnormal   Collection Time: 01/20/21 11:42 AM   Specimen: BLOOD  Result Value Ref Range Status   Specimen Description   Final    BLOOD LEFT ANTECUBITAL Performed at [facility], [address] [place], [place] [address]    Special Requests   Final    BOTTLES DRAWN AEROBIC AND ANAEROBIC Blood Culture results may not be optimal due to an inadequate volume of blood received in culture bottles Performed at [facility], [address], [place], [place] [address]    Culture  Setup Time   Final    GRAM NEGATIVE RODS ANAEROBIC BOTTLE ONLY CRITICAL RESULT CALLED TO, READ BACK BY AND VERIFIED WITH: CAROLINE CHILDS AT 1129 01/21/21.PMF Performed at [facility], [place] [address] [place], [place] [address]    Culture ESCHERICHIA COLI (A)  Final   Report Status 01/23/2021 FINAL  Final   Organism ID, Bacteria ESCHERICHIA COLI  Final      Susceptibility   Escherichia coli - MIC*    AMPICILLIN 8 SENSITIVE Sensitive     CEFAZOLIN <=4 SENSITIVE Sensitive     CEFEPIME <=0.12 SENSITIVE Sensitive     CEFTAZIDIME <=1 SENSITIVE Sensitive     CEFTRIAXONE <=0.25 SENSITIVE Sensitive     CIPROFLOXACIN <=0.25 SENSITIVE Sensitive     GENTAMICIN <=1 SENSITIVE Sensitive     IMIPENEM 1 SENSITIVE Sensitive     TRIMETH/SULFA <=20 SENSITIVE Sensitive     AMPICILLIN/SULBACTAM <=2 SENSITIVE Sensitive     PIP/TAZO <=4 SENSITIVE Sensitive     * ESCHERICHIA COLI  Urine Culture     Status: Abnormal   Collection Time: 01/20/21 11:42 AM   Specimen: Urine, Clean Catch  Result Value Ref Range Status   Specimen Description   Final    URINE, CLEAN CATCH Performed at [facility], [address], [place], [place] [address]    Special Requests   Final    NONE Performed at [facility], [address] [place],  [address]    Culture >=100,000 COLONIES/mL ESCHERICHIA COLI (A)  Final   Report Status 01/23/2021 FINAL  Final   Organism ID, Bacteria  ESCHERICHIA COLI (A)  Final      Susceptibility   Escherichia coli - MIC*    AMPICILLIN <=2 SENSITIVE Sensitive     CEFAZOLIN <=4 SENSITIVE Sensitive     CEFEPIME <=0.12 SENSITIVE Sensitive     CEFTRIAXONE <=0.25 SENSITIVE Sensitive     CIPROFLOXACIN <=0.25 SENSITIVE Sensitive     GENTAMICIN <=1 SENSITIVE Sensitive     IMIPENEM <=0.25 SENSITIVE Sensitive     NITROFURANTOIN <=16 SENSITIVE Sensitive     TRIMETH/SULFA <=20 SENSITIVE Sensitive     AMPICILLIN/SULBACTAM <=2 SENSITIVE Sensitive     PIP/TAZO <=4 SENSITIVE Sensitive     * >=100,000 COLONIES/mL ESCHERICHIA COLI  Blood Culture ID Panel (Reflexed)     Status: Abnormal   Collection Time: 01/20/21 11:42 AM  Result Value Ref Range Status   Enterococcus faecalis NOT DETECTED NOT DETECTED Final   Enterococcus Faecium NOT DETECTED NOT DETECTED Final   Listeria monocytogenes NOT DETECTED NOT DETECTED Final   Staphylococcus species NOT DETECTED NOT DETECTED Final   Staphylococcus aureus (BCID) NOT DETECTED NOT DETECTED Final   Staphylococcus epidermidis NOT DETECTED NOT DETECTED Final   Staphylococcus lugdunensis NOT DETECTED NOT DETECTED Final   Streptococcus species NOT DETECTED NOT DETECTED Final   Streptococcus agalactiae NOT DETECTED NOT DETECTED Final   Streptococcus pneumoniae NOT DETECTED NOT DETECTED Final   Streptococcus pyogenes NOT DETECTED NOT DETECTED Final   A.calcoaceticus-baumannii NOT DETECTED NOT DETECTED Final   Bacteroides fragilis NOT DETECTED NOT DETECTED Final   Enterobacterales DETECTED (A) NOT DETECTED Final    Comment: Enterobacterales represent a large order of gram negative bacteria, not a single organism. CRITICAL RESULT CALLED TO, READ BACK BY AND VERIFIED WITH: CAROLINE CHILDS AT 1129 01/21/21.PMF    Enterobacter cloacae complex NOT DETECTED NOT DETECTED Final   Escherichia coli DETECTED (A) NOT DETECTED Final    Comment: CRITICAL RESULT CALLED TO, READ BACK BY AND VERIFIED WITH: CAROLINE CHILDS AT  1129 01/21/21.PMF    Klebsiella aerogenes NOT DETECTED NOT DETECTED Final   Klebsiella oxytoca NOT DETECTED NOT DETECTED Final   Klebsiella pneumoniae NOT DETECTED NOT DETECTED Final   Proteus species NOT DETECTED NOT DETECTED Final   Salmonella species NOT DETECTED NOT DETECTED Final   Serratia marcescens NOT DETECTED NOT DETECTED Final   Haemophilus influenzae NOT DETECTED NOT DETECTED Final   Neisseria meningitidis NOT DETECTED NOT DETECTED Final   Pseudomonas aeruginosa NOT DETECTED NOT DETECTED Final   Stenotrophomonas maltophilia NOT DETECTED NOT DETECTED Final   Candida albicans NOT DETECTED NOT DETECTED Final   Candida auris NOT DETECTED NOT DETECTED Final   Candida glabrata NOT DETECTED NOT DETECTED Final   Candida krusei NOT DETECTED NOT DETECTED Final   Candida parapsilosis NOT DETECTED NOT DETECTED Final   Candida tropicalis NOT DETECTED NOT DETECTED Final   Cryptococcus neoformans/gattii NOT DETECTED NOT DETECTED Final   CTX-M ESBL NOT DETECTED NOT DETECTED Final   Carbapenem resistance IMP NOT DETECTED NOT DETECTED Final   Carbapenem resistance KPC NOT DETECTED NOT DETECTED Final   Carbapenem resistance NDM NOT DETECTED NOT DETECTED Final   Carbapenem resist OXA 48 LIKE NOT DETECTED NOT DETECTED Final   Carbapenem resistance VIM NOT DETECTED NOT DETECTED Final    Comment: Performed at [facility], [address] [place], [place] [address]  Resp Panel by RT-PCR (Flu A&B, Covid) Nasopharyngeal Swab     Status: None   Collection Time: 01/20/21 12:21 PM   Specimen: Nasopharyngeal  Swab; Nasopharyngeal(NP) swabs in vial transport medium  Result Value Ref Range Status   SARS Coronavirus 2 by RT PCR NEGATIVE NEGATIVE Final    Comment: (NOTE) SARS-CoV-2 target nucleic acids are NOT DETECTED.  The SARS-CoV-2 RNA is generally detectable in upper respiratory specimens during the acute phase of infection. The lowest concentration of SARS-CoV-2 viral copies this assay  can detect is 138 copies/mL. A negative result does not preclude SARS-Cov-2 infection and should not be used as the sole basis for treatment or other patient management decisions. A negative result may occur with  improper specimen collection/handling, submission of specimen other than nasopharyngeal swab, presence of viral mutation(s) within the areas targeted by this assay, and inadequate number of viral copies(<138 copies/mL). A negative result must be combined with clinical observations, patient history, and epidemiological information. The expected result is Negative.  Fact Sheet for Patients:  EntrepreneurPulse.com.au  Fact Sheet for Healthcare Providers:  IncredibleEmployment.be  This test is no t yet approved or cleared by the [place] FDA and  has been authorized for detection and/or diagnosis of SARS-CoV-2 by FDA under an Emergency Use Authorization (EUA). This EUA will remain  in effect (meaning this test can be used) for the duration of the COVID-19 declaration under Section 564(b)(1) of the Act, 21 U.S.C.section 360bbb-3(b)(1), unless the authorization is terminated  or revoked sooner.       Influenza A by PCR NEGATIVE NEGATIVE Final   Influenza B by PCR NEGATIVE NEGATIVE Final    Comment: (NOTE) The Xpert Xpress SARS-CoV-2/FLU/RSV plus assay is intended as an aid in the diagnosis of influenza from Nasopharyngeal swab specimens and should not be used as a sole basis for treatment. Nasal washings and aspirates are unacceptable for Xpert Xpress SARS-CoV-2/FLU/RSV testing.  Fact Sheet for Patients: EntrepreneurPulse.com.au  Fact Sheet for Healthcare Providers: IncredibleEmployment.be  This test is not yet approved or cleared by the [place] FDA and has been authorized for detection and/or diagnosis of SARS-CoV-2 by FDA under an Emergency Use Authorization (EUA). This EUA will  remain in effect (meaning this test can be used) for the duration of the COVID-19 declaration under Section 564(b)(1) of the Act, 21 U.S.C. section 360bbb-3(b)(1), unless the authorization is terminated or revoked.  Performed at [facility], [address] [place], [place] [address]   MRSA Next Gen by PCR, Nasal     Status: None   Collection Time: 01/20/21  5:03 PM   Specimen: Nasal Mucosa; Nasal Swab  Result Value Ref Range Status   MRSA by PCR Next Gen NOT DETECTED NOT DETECTED Final    Comment: (NOTE) The GeneXpert MRSA Assay (FDA approved for NASAL specimens only), is one component of a comprehensive MRSA colonization surveillance program. It is not intended to diagnose MRSA infection nor to guide or monitor treatment for MRSA infections. Test performance is not FDA approved in patients less than 12 years old. Performed at [facility], [address] [place], [place] [address]          Radiology Studies: US PELVIS (TRANSABDOMINAL ONLY)  Result Date: 01/25/2021 CLINICAL DATA:  Ovarian mass on CT EXAM: TRANSABDOMINAL ULTRASOUND OF PELVIS DOPPLER ULTRASOUND OF OVARIES TECHNIQUE: Transabdominal ultrasound examination of the pelvis was performed including evaluation of the uterus, ovaries, adnexal regions, and pelvic cul-de-sac. Color and duplex Doppler ultrasound was utilized to evaluate blood flow to the ovaries. COMPARISON:  CT abdomen/pelvis dated 01/20/2021 FINDINGS: Uterus Measurements: 7.0 x 5.6 x 5.4 cm = volume: 112 mL. 2.5  x 2.1 x 1.9 cm intramural fibroid in the left anterior uterine body. Endometrium Thickness: 4 mm.  No focal abnormality visualized. Right ovary Measurements: 8.8 x 5.6 x 6.7 cm = volume: 173 mL. 7.0 x 4.4 x 4.8 cm cystic lesion with incomplete peripheral septation. No mural nodularity/solid component. Left ovary Not discretely visualized.  No adnexal mass is seen. Pulsed Doppler evaluation demonstrates normal low-resistance arterial  and venous waveforms in both ovaries. Other: No pelvic ascites. IMPRESSION: 7.0 cm minimally complex cystic lesion with incomplete peripheral septation, but without overt malignant features. Given size, a benign ovarian neoplasm is favored. However, at the patient's age, it is unclear that aggressive management is required. Consider follow-up pelvic ultrasound in 1 year if clinically warranted. Left ovary is not discretely visualized. No evidence of right ovarian torsion. Electronically Signed   By: Julian Hy M.D.   On: 01/25/2021 21:56   US ABDOMINAL PELVIC ART/VENT FLOW DOPPLER LIMITED  Result Date: 01/25/2021 CLINICAL DATA:  Ovarian mass on CT EXAM: TRANSABDOMINAL ULTRASOUND OF PELVIS DOPPLER ULTRASOUND OF OVARIES TECHNIQUE: Transabdominal ultrasound examination of the pelvis was performed including evaluation of the uterus, ovaries, adnexal regions, and pelvic cul-de-sac. Color and duplex Doppler ultrasound was utilized to evaluate blood flow to the ovaries. COMPARISON:  CT abdomen/pelvis dated 01/20/2021 FINDINGS: Uterus Measurements: 7.0 x 5.6 x 5.4 cm = volume: 112 mL. 2.5 x 2.1 x 1.9 cm intramural fibroid in the left anterior uterine body. Endometrium Thickness: 4 mm.  No focal abnormality visualized. Right ovary Measurements: 8.8 x 5.6 x 6.7 cm = volume: 173 mL. 7.0 x 4.4 x 4.8 cm cystic lesion with incomplete peripheral septation. No mural nodularity/solid component. Left ovary Not discretely visualized.  No adnexal mass is seen. Pulsed Doppler evaluation demonstrates normal low-resistance arterial and venous waveforms in both ovaries. Other: No pelvic ascites. IMPRESSION: 7.0 cm minimally complex cystic lesion with incomplete peripheral septation, but without overt malignant features. Given size, a benign ovarian neoplasm is favored. However, at the patient's age, it is unclear that aggressive management is required. Consider follow-up pelvic ultrasound in 1 year if clinically warranted. Left  ovary is not discretely visualized. No evidence of right ovarian torsion. Electronically Signed   By: Julian Hy M.D.   On: 01/25/2021 21:56        Scheduled Meds:  chlorhexidine gluconate (MEDLINE KIT)  15 mL Mouth Rinse BID   feeding supplement  237 mL Oral BID BM   metoprolol tartrate  12.5 mg Oral Q6H   Continuous Infusions:  sodium chloride Stopped (01/23/21 1244)    ceFAZolin (ANCEF) IV 2 g (01/26/21 0545)   lactated ringers Stopped (01/24/21 2004)     LOS: 6 days    Time spent: 65mnutes with more than 50% on COC    SNolberto Hanlon MD [facility]   To contact the attending provider between 7A-7P or the covering provider during after hours 7P-7A, please log into the web site [email or web address] and access using universal [place] password for that web site. If you do not have the password, please call the hospital operator.  01/26/2021, 8:16 AM

## 2021-01-26 NOTE — Progress Notes (Addendum)
Occupational Therapy Treatment Patient Details Name: Lisa Gamble MRN: [id number] DOB: 08/30/1925 Today's Date: 01/26/2021    History of present illness Patient is a  87 year old female with past medical history of only asthma, anxiety and arthritis who present to the emergency room fever, altered mental status.  Significant leukocytosis, tachycardia.  She developed hypotension. She is diagnosed with septic shock due to UTI.  CT scan showed left-sided 8 mm ureteral stone with hydronephrosis. She had a ureter stent placed on 8/26.  Postoperatively, patient developed respiratory failure requiring mechanical ventilation and vasopressors.   OT comments  Lisa Gamble was seen for OT treatment on this date. Upon arrival to room pt reclined in bed, awakes easily to voice. Pt requires MOD A for LBD seated EOB. MIN A sit>stand at EOB, MOD A for SPT. SETUP + MAX cues for self-feeding reclined in chair. Pt making good progress toward goals. Pt continues to benefit from skilled OT services to maximize return to PLOF and minimize risk of future falls, injury, caregiver burden, and readmission. Will continue to follow POC. Discharge recommendation remains appropriate.    Follow Up Recommendations  SNF;Supervision/Assistance - 24 hour    Equipment Recommendations  Other (comment) (TBD at next venue of care)    Recommendations for Other Services      Precautions / Restrictions Precautions Precautions: Fall Restrictions Weight Bearing Restrictions: No       Mobility Bed Mobility Overal bed mobility: Needs Assistance Bed Mobility: Supine to Sit     Supine to sit: Max assist;HOB elevated          Transfers Overall transfer level: Needs assistance   Transfers: Sit to/from Stand;Stand Pivot Transfers Sit to Stand: Min assist Stand pivot transfers: Mod assist            Balance Overall balance assessment: Needs assistance Sitting-balance support: Feet supported Sitting  balance-Leahy Scale: Fair     Standing balance support: Bilateral upper extremity supported Standing balance-Leahy Scale: Poor                             ADL either performed or assessed with clinical judgement   ADL Overall ADL's : Needs assistance/impaired                                       General ADL Comments: MOD A for LBD seated EOB. MIN A for ADL t/f. SETUP + MAX cues for self-feeding reclined in chair.      Cognition Arousal/Alertness: Awake/alert Behavior During Therapy: WFL for tasks assessed/performed Overall Cognitive Status: History of cognitive impairments - at baseline                                 General Comments: pt follows one step commands. Fixated on not wanting to eat her breakfast - when handed food she eats 2 bites then states "I cant, Im no good"        Exercises Exercises: Other exercises Other Exercises Other Exercises: Pt educated re: importance of nutrition for functional strengthening, re-oriented to situation frequently Other Exercises: LBD, sup>sit, sit<>stand, SPT, sitting/standing balance/tolerance, , self-feeding           Pertinent Vitals/ Pain       Pain Assessment: Faces Faces Pain Scale: Hurts a little bit Pain Location: "  belly" Pain Descriptors / Indicators: Discomfort Pain Intervention(s): Limited activity within patient's tolerance         Frequency  Min 2X/week        Progress Toward Goals  OT Goals(current goals can now be found in the care plan section)  Progress towards OT goals: Progressing toward goals  Acute Rehab OT Goals Patient Stated Goal: to go home OT Goal Formulation: With family Time For Goal Achievement: 02/06/21 Potential to Achieve Goals: Good ADL Goals Pt Will Perform Grooming: standing;with min assist Pt Will Transfer to Toilet: with supervision;ambulating;bedside commode Pt Will Perform Toileting - Clothing Manipulation and hygiene: with  modified independence;sitting/lateral leans  Plan Discharge plan remains appropriate;Frequency remains appropriate    Co-evaluation                 AM-PAC OT "6 Clicks" Daily Activity     Outcome Measure   Help from another person eating meals?: A Little Help from another person taking care of personal grooming?: A Little Help from another person toileting, which includes using toliet, bedpan, or urinal?: A Lot Help from another person bathing (including washing, rinsing, drying)?: A Lot Help from another person to put on and taking off regular upper body clothing?: A Little Help from another person to put on and taking off regular lower body clothing?: A Lot 6 Click Score: 15    End of Session    OT Visit Diagnosis: Other abnormalities of gait and mobility (R26.89);Muscle weakness (generalized) (M62.81)   Activity Tolerance Patient tolerated treatment well   Patient Left in chair;with call bell/phone within reach;with chair alarm set   Nurse Communication Mobility status        Time: [id number] OT Time Calculation (min): 16 min  Charges: OT General Charges $OT Visit: 1 Visit OT Treatments $Self Care/Home Management : 8-22 mins   Dessie Coma, M.S. OTR/L  01/26/21, 12:53 PM  ascom [phone]

## 2021-01-26 NOTE — Progress Notes (Signed)
Family took pt's glasses home because they are broke

## 2021-01-26 NOTE — Progress Notes (Signed)
Physical Therapy Treatment Patient Details Name: DYUTHI EUSTACHE MRN: [id number] DOB: 1926-01-20 Today's Date: 01/26/2021    History of Present Illness Patient is a  87 year old female with past medical history of only asthma, anxiety and arthritis who present to the emergency room fever, altered mental status.  Significant leukocytosis, tachycardia.  She developed hypotension. She is diagnosed with septic shock due to UTI.  CT scan showed left-sided 8 mm ureteral stone with hydronephrosis. She had a ureter stent placed on 8/26.  Postoperatively, patient developed respiratory failure requiring mechanical ventilation and vasopressors.    PT Comments    Patient sitting up in chair on arrival to the room. She tells me that she got up to the chair by herself this morning (OT assisted her up to chair earlier). Patient continued to require assistance for sit to stand transfers with cues for hand placement and technique to facilitate independence with standing. 3 standing bouts performed with rest break required between bouts of standing due to fatigue with minimal activity. Unable to progress to walking due to poor standing activity tolerance. SNF is recommended at discharge. Recommend to continue PT to maximize independence and decrease caregiver burden.     Follow Up Recommendations  SNF     Equipment Recommendations   (to be determined at next level of care)    Recommendations for Other Services       Precautions / Restrictions Precautions Precautions: Fall Restrictions Weight Bearing Restrictions: No    Mobility  Bed Mobility Overal bed mobility: Needs Assistance Bed Mobility: Supine to Sit     Supine to sit: Max assist;HOB elevated     General bed mobility comments: not observed as patient sitting up on arrival and post session    Transfers Overall transfer level: Needs assistance   Transfers: Sit to/from Stand Sit to Stand: Mod assist Stand pivot transfers: Mod  assist       General transfer comment: lifting and lowering assistance provided. cues for hand placement and weight shifting forward. 3 standing bouts performed with short seated rest break between bouts of standing  Ambulation/Gait             General Gait Details: unable to progress ambulation due to fatigue with minimal standing activity. standing tolerance less than 20 seconds, performed x 3 bouts   Stairs             Wheelchair Mobility    Modified Rankin (Stroke Patients Only)       Balance Overall balance assessment: Needs assistance Sitting-balance support: Feet supported Sitting balance-Leahy Scale: Fair     Standing balance support: Bilateral upper extremity supported Standing balance-Leahy Scale: Poor Standing balance comment: Min A required to maintain midline standing position                            Cognition Arousal/Alertness: Awake/alert Behavior During Therapy: WFL for tasks assessed/performed Overall Cognitive Status: History of cognitive impairments - at baseline                                 General Comments: patient able to follow single step commands with extra time      Exercises General Exercises - Lower Extremity Ankle Circles/Pumps: Strengthening;Both;AAROM;10 reps;Seated Long Arc Quad: AAROM;Strengthening;Both;10 reps;Seated Hip ABduction/ADduction: AAROM;Strengthening;Both;10 reps;Seated Other Exercises Other Exercises: verbal cues for sequencing. heart rate 114bpm with activity and Sp02 94% on room  air Other Exercises: LBD, sup>sit, sit<>stand, SPT, sitting/standing balance/tolerance, , self-feeding    General Comments        Pertinent Vitals/Pain Pain Assessment: No/denies pain Faces Pain Scale: Hurts a little bit Pain Location: "belly" Pain Descriptors / Indicators: Discomfort Pain Intervention(s): Limited activity within patient's tolerance    Home Living                       Prior Function            PT Goals (current goals can now be found in the care plan section) Acute Rehab PT Goals Patient Stated Goal: to go home PT Goal Formulation: With patient Time For Goal Achievement: 02/06/21 Potential to Achieve Goals: Fair Progress towards PT goals: Progressing toward goals    Frequency    Min 2X/week      PT Plan Current plan remains appropriate    Co-evaluation              AM-PAC PT "6 Clicks" Mobility   Outcome Measure  Help needed turning from your back to your side while in a flat bed without using bedrails?: A Lot Help needed moving from lying on your back to sitting on the side of a flat bed without using bedrails?: A Lot Help needed moving to and from a bed to a chair (including a wheelchair)?: A Lot Help needed standing up from a chair using your arms (e.g., wheelchair or bedside chair)?: A Lot Help needed to walk in hospital room?: A Lot Help needed climbing 3-5 steps with a railing? : Total 6 Click Score: 11    End of Session Equipment Utilized During Treatment: Gait belt Activity Tolerance: Patient limited by fatigue Patient left: in chair;with call bell/phone within reach;with chair alarm set Nurse Communication: Mobility status PT Visit Diagnosis: Unsteadiness on feet (R26.81);Muscle weakness (generalized) (M62.81);Other abnormalities of gait and mobility (R26.89)     Time: [id number] PT Time Calculation (min) (ACUTE ONLY): 16 min  Charges:  $Therapeutic Activity: 8-22 mins                    Minna Merritts, PT, MPT    Percell Locus 01/26/2021, 1:04 PM

## 2021-01-26 NOTE — Progress Notes (Signed)
pt lungs sound congested. pt is able to cough when requested. small amount of sputum. would benefit from chest PT. Using IS paged MD Kurtis Bushman

## 2021-01-26 NOTE — TOC Progression Note (Signed)
Transition of Care [facility]) - Progression Note    Patient Details  Name: BRYANA BOXER MRN: [id number] Date of Birth: 01/09/1926  Transition of Care [facility]) CM/SW [place], RN Phone Number: 01/26/2021, 3:36 PM  Clinical Narrative:   Patient's son and daughter in law accepted compass bed offer. Rickey at [employer] aware.  Patient is able to be accepted tomorrow, if medically discharged.      Expected Discharge Plan: [place] Barriers to Discharge: Continued Medical Work up  Expected Discharge Plan and Services Expected Discharge Plan: [place] In-house Referral: Clinical Social Work   Post Acute Care Choice: [place] arrangements for the past 2 months: Apartment                 DME Arranged: N/A DME Agency: NA       HH Arranged: NA [place] Agency: NA         Social Determinants of Health (SDOH) Interventions    Readmission Risk Interventions No flowsheet data found.

## 2021-01-27 ENCOUNTER — Inpatient Hospital Stay: Payer: Medicare Other

## 2021-01-27 DIAGNOSIS — E440 Moderate protein-calorie malnutrition: Secondary | ICD-10-CM

## 2021-01-27 LAB — CBC
HCT: 37.5 % (ref 36.0–46.0)
Hemoglobin: 12.2 g/dL (ref 12.0–15.0)
MCH: 29.8 pg (ref 26.0–34.0)
MCHC: 32.5 g/dL (ref 30.0–36.0)
MCV: 91.7 fL (ref 80.0–100.0)
Platelets: 144 10*3/uL — ABNORMAL LOW (ref 150–400)
RBC: 4.09 MIL/uL (ref 3.87–5.11)
RDW: 13.5 % (ref 11.5–15.5)
WBC: 14.9 10*3/uL — ABNORMAL HIGH (ref 4.0–10.5)
nRBC: 0 % (ref 0.0–0.2)

## 2021-01-27 LAB — MAGNESIUM: Magnesium: 2 mg/dL (ref 1.7–2.4)

## 2021-01-27 LAB — PHOSPHORUS: Phosphorus: 2.9 mg/dL (ref 2.5–4.6)

## 2021-01-27 MED ORDER — POLYETHYLENE GLYCOL 3350 17 G PO PACK: 17.0000 g | PACK | Freq: Every day | ORAL | 0 refills | Status: DC | PRN

## 2021-01-27 MED ORDER — METOPROLOL SUCCINATE ER 25 MG PO TB24
25.0000 mg | ORAL_TABLET | Freq: Every day | ORAL | Status: DC
  Administered 2021-01-27: 10:00:00 25 mg via ORAL
  Filled 2021-01-27: qty 1

## 2021-01-27 MED ORDER — ALPRAZOLAM 0.5 MG PO TABS: 0.5000 mg | ORAL_TABLET | Freq: Every evening | ORAL | 0 refills | Status: AC | PRN

## 2021-01-27 MED ORDER — METOPROLOL SUCCINATE ER 25 MG PO TB24: 25.0000 mg | ORAL_TABLET | Freq: Every day | ORAL | Status: DC

## 2021-01-27 MED ORDER — CIPROFLOXACIN HCL 500 MG PO TABS: 500.0000 mg | ORAL_TABLET | Freq: Two times a day (BID) | ORAL | 0 refills | Status: AC

## 2021-01-27 MED ORDER — ENSURE ENLIVE PO LIQD: 237.0000 mL | Freq: Two times a day (BID) | ORAL | 12 refills | Status: DC

## 2021-01-27 MED ORDER — MEGESTROL ACETATE 40 MG PO TABS: 40.0000 mg | ORAL_TABLET | Freq: Every day | ORAL | Status: DC

## 2021-01-27 MED ORDER — GUAIFENESIN ER 600 MG PO TB12: 600.0000 mg | ORAL_TABLET | Freq: Two times a day (BID) | ORAL | Status: DC

## 2021-01-27 MED ORDER — GUAIFENESIN ER 600 MG PO TB12
600.0000 mg | ORAL_TABLET | Freq: Two times a day (BID) | ORAL | Status: DC
  Filled 2021-01-27: qty 1

## 2021-01-27 NOTE — Discharge Summary (Signed)
Lisa Gamble HCW:[id number] DOB: 07-31-1925 DOA: 01/20/2021  PCP: [name], MD  Admit date: 01/20/2021 Discharge date: 01/27/2021  Admitted From: SNF Disposition:  SNF  Recommendations for Outpatient Follow-up:  Follow up with PCP in 1 week Please obtain BMP/CBC in one week Follow up with urology Dr. Chrystine Oiler in one week  Discharge Condition:Stable CODE STATUS:DNR  Diet recommendation: Dysphagia 2   Brief/Interim Summary: Per HPI and review of records:Lisa Gamble is a 87 year old female with a past medical history as listed below who presented to [facility] ED on 01/20/2021 due to altered mental status and fever of 103 and altered mental status. Her COVID-19 PCR was negative.Initial vital signs: Temperature 103 Fahrenheit rectally, RR 22, pulse 133, blood pressure 95/67, SPO2 99% on room air Labs: Lactic acid 10.9, WBC 10.0 with neutrophilia, bicarbonate 19, anion gap 18, BUN 38, creatinine 1.44, glucose 158, AST 45, ALT 18, total bilirubin 1.4 Urinalysis consistent with urinary tract infection Imaging: CT abdomen pelvis: Concerning for 8 mm left proximal ureteral stone with upstream hydronephrosis and multiple renal stones, in addition to a cystic 7 cm lesion on the right ovary which has increased from prior, as well as profound periportal edema and intrahepatic biliary ductal dilation, and a large stool ball in the rectum. CT head and cervical spine: 1. Atrophy and chronic microvascular ischemic change. No acute intracranial abnormality 2. Advanced cervical spondylosis.  Negative for fracture. Chest x-ray: Negative for acute cardiopulmonary process   She met sepsis criteria due to tachycardia, hypotension, and fever.  She received IV fluids and broad-spectrum antibiotics.  Urology was consulted.  She was taken to the OR by Dr. Diamantina Providence for cystoscopy and left ureteral stent placement.  Was started on antibiotics IV.  Was transferred to ICU postop.  Per ICU note patient has severe  acute hypoxic and hypercapnic respiratory failure and was on mechanical ventilating support.  She was weaned off and extubated.  She was on pressors for septic shock due to urosepsis.  Blood cultures grew E. coli.  Once patient was extubated patient was transferred to the hospitalist service on 8/28.  Patient's appetite was poor she was started on Megace. Also psychiatry was consulted due to family wondering if patient has capacity to decide to make her decision about going home versus SNF.  Psychiatrist final decision was patient does not have capacity to determine disposition.  Please note patient's mental status does wax and wane at times.   Severe ACUTE Hypoxic and Hypercapnic Respiratory Failure Status post extubation On room air now Continue Mucinex Continue albuterol as needed     SEPTIC shock Severe urosepsis E.coli speticemia E.coli pyelonephritis Due to urosepsis from left ureteropelvic junction stone with hydronephrosis Was on pressors and empiric IV antibiotics Was treated with stress dose steroids and aggressive IV fluid resuscitation Then transferred when hemodynamically stable to hospitalist service ID consulted.   Plan to complete the rest of ciprofloxacin x7 days to complete 2 weeks course       Acute kidney injury secondary to septic shock and obstruction. Anion gap metabolic acidosis with severe lactic acidosis. Hypokalemia. Hypophosphatemia AKI now at baseline and improved  with ivf   Acute toxic metabolic encephalopathy Dementia: Due to above Improved At times her mental status does wax and wanes likely from underlying dementia   Poor appetite Encourage p.o. 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 pelvic ultrasound with benign ovarian neoplasm, radiology recommended follow-up pelvic ultrasound in 1  year if clinically warranted  Severe thrombocytopenia. Secondary to septic  shock. Improving. Continue to monitor labs as outpatient  Discharge Diagnoses:  Active Problems:   Septic shock (HCC)   SVT (supraventricular tachycardia) (HCC)   AKI (acute kidney injury) ([place])   Increased anion gap metabolic acidosis   Postoperative respiratory failure (HCC)   Endotracheally intubated   Hyperglycemia   Acute metabolic encephalopathy   Obstruction of left ureteropelvic junction (UPJ) due to stone   Malnutrition of moderate degree   Acute reaction to situational stress   Altered mental status    Discharge Instructions  Discharge Instructions     Diet - low sodium heart healthy   Complete by: As directed    Increase activity slowly   Complete by: As directed       Allergies as of 01/27/2021       Reactions   Amitriptyline    Other reaction(s): Unknown Onset 01/06/1999.   Codeine    Doesn't remember reaction   Lithium    Other reaction(s): Unknown Onset 07/24/2009   Tetracycline    Other reaction(s): Unknown Onset 01/06/1999.   Tramadol Diarrhea   Trazodone    Other reaction(s): Unknown Onset 07/25/2009.   Venlafaxine    Other reaction(s): Unknown Onset 01/06/1999.        Medication List     STOP taking these medications    erythromycin ophthalmic ointment   melatonin 3 MG Tabs tablet       TAKE these medications    albuterol 108 (90 Base) MCG/ACT inhaler Commonly known as: VENTOLIN HFA Inhale into the lungs every 6 (six) hours as needed for wheezing or shortness of breath.   ALPRAZolam 0.5 MG tablet Commonly known as: XANAX Take 0.5 mg by mouth at bedtime as needed for anxiety.   ciprofloxacin 500 MG tablet Commonly known as: CIPRO Take 1 tablet (500 mg total) by mouth 2 (two) times daily for 6 days.   feeding supplement Liqd Take 237 mLs by mouth 2 (two) times daily between meals.   FOLIC ACID PO Take by mouth daily.   guaiFENesin 600 MG 12 hr tablet Commonly known as: MUCINEX Take 1 tablet (600 mg total) by mouth 2  (two) times daily.   LUBRICANT EYE DROPS OP Apply to eye daily.   megestrol 40 MG tablet Commonly known as: MEGACE Take 1 tablet (40 mg total) by mouth daily. Start taking on: January 28, 2021   metoprolol succinate 25 MG 24 hr tablet Commonly known as: TOPROL-XL Take 1 tablet (25 mg total) by mouth daily. Start taking on: January 28, 2021   polyethylene glycol 17 g packet Commonly known as: MIRALAX / GLYCOLAX Take 17 g by mouth daily as needed for moderate constipation.   VITAMIN B COMPLEX PO Take by mouth daily.   vitamin B-12 1000 MCG tablet Commonly known as: CYANOCOBALAMIN Take 1,500 mcg by mouth daily.   Vitamin D3 50 MCG (2000 UT) Tabs Take by mouth daily.        Allergies  Allergen Reactions   Amitriptyline     Other reaction(s): Unknown Onset 01/06/1999.   Codeine     Doesn't remember reaction   Lithium     Other reaction(s): Unknown Onset 07/24/2009   Tetracycline     Other reaction(s): Unknown Onset 01/06/1999.   Tramadol Diarrhea   Trazodone     Other reaction(s): Unknown Onset 07/25/2009.   Venlafaxine     Other reaction(s): Unknown Onset 01/06/1999.    Consultations: Psych,  ID, pccm   Procedures/Studies: DG Chest 2 View  Result Date: 01/20/2021 CLINICAL DATA:  Suspected sepsis. EXAM: CHEST - 2 VIEW COMPARISON:  12/19/2012 FINDINGS: Both lungs are clear. Heart and mediastinum are within normal limits. No acute bone abnormality. No pleural effusions. IMPRESSION: No active cardiopulmonary disease. Electronically Signed   By: Markus Daft M.D.   On: 01/20/2021 13:35   DG Abd 1 View  Result Date: 01/22/2021 CLINICAL DATA:  Abdominal pain EXAM: ABDOMEN - 1 VIEW COMPARISON:  01/20/2021 FINDINGS: Left double-pigtail ureteral stent. Nonobstructive bowel gas pattern. Degenerative changes of the lumbar spine.  Vascular calcifications. Left hip arthroplasty. IMPRESSION: Left double-pigtail ureteral stent. Otherwise unremarkable. Electronically Signed    By: Julian Hy M.D.   On: 01/22/2021 02:57   DG Abd 1 View  Result Date: 01/20/2021 CLINICAL DATA:  Enteric catheter placement EXAM: ABDOMEN - 1 VIEW COMPARISON:  None. FINDINGS: Frontal view of the abdomen and upper pelvis was obtained. Enteric catheter tip and side port project over the gastric body. Left ureteral stent is partially visualized, proximal aspect overlying the left renal silhouette. Numerous bilateral renal calculi are again identified. There is extensive atherosclerosis. IMPRESSION: 1. Enteric catheter overlying gastric body. 2. Left ureteral stent. 3. Bilateral nephrolithiasis. Electronically Signed   By: Randa Ngo M.D.   On: 01/20/2021 18:58   CT HEAD WO CONTRAST (5MM)  Result Date: 01/20/2021 CLINICAL DATA:  Head trauma.  Code sepsis. EXAM: CT HEAD WITHOUT CONTRAST CT CERVICAL SPINE WITHOUT CONTRAST TECHNIQUE: Multidetector CT imaging of the head and cervical spine was performed following the standard protocol without intravenous contrast. Multiplanar CT image reconstructions of the cervical spine were also generated. COMPARISON:  None. FINDINGS: CT HEAD FINDINGS Brain: Generalized atrophy without hydrocephalus. Periventricular white matter hypodensity bilaterally. No acute infarct, hemorrhage, mass. Vascular: Negative for hyperdense vessel Skull: Negative Sinuses/Orbits: Paranasal sinuses clear. Bilateral cataract surgery. Other: None CT CERVICAL SPINE FINDINGS Alignment: Anterolisthesis C2-3 and C3-4. Retrolisthesis C4-5, C5-6, C6-7. Mild anterolisthesis C7-T1. Cervical kyphosis at C4. Skull base and vertebrae: Negative for fracture Soft tissues and spinal canal: No acute abnormality. Atherosclerotic calcification the carotid artery bilaterally. Disc levels: Advanced spondylosis. Multilevel disc and facet degeneration and spurring. Mild spinal stenosis C4-5, C5-6, C6-7. Mild foraminal narrowing due to spurring. Upper chest: Mild apical scarring on the right. Other: None  IMPRESSION: 1. Atrophy and chronic microvascular ischemic change. No acute intracranial abnormality 2. Advanced cervical spondylosis.  Negative for fracture. Electronically Signed   By: Franchot Gallo M.D.   On: 01/20/2021 13:17   CT Cervical Spine Wo Contrast  Result Date: 01/20/2021 CLINICAL DATA:  Head trauma.  Code sepsis. EXAM: CT HEAD WITHOUT CONTRAST CT CERVICAL SPINE WITHOUT CONTRAST TECHNIQUE: Multidetector CT imaging of the head and cervical spine was performed following the standard protocol without intravenous contrast. Multiplanar CT image reconstructions of the cervical spine were also generated. COMPARISON:  None. FINDINGS: CT HEAD FINDINGS Brain: Generalized atrophy without hydrocephalus. Periventricular white matter hypodensity bilaterally. No acute infarct, hemorrhage, mass. Vascular: Negative for hyperdense vessel Skull: Negative Sinuses/Orbits: Paranasal sinuses clear. Bilateral cataract surgery. Other: None CT CERVICAL SPINE FINDINGS Alignment: Anterolisthesis C2-3 and C3-4. Retrolisthesis C4-5, C5-6, C6-7. Mild anterolisthesis C7-T1. Cervical kyphosis at C4. Skull base and vertebrae: Negative for fracture Soft tissues and spinal canal: No acute abnormality. Atherosclerotic calcification the carotid artery bilaterally. Disc levels: Advanced spondylosis. Multilevel disc and facet degeneration and spurring. Mild spinal stenosis C4-5, C5-6, C6-7. Mild foraminal narrowing due to spurring. Upper chest: Mild apical scarring  on the right. Other: None IMPRESSION: 1. Atrophy and chronic microvascular ischemic change. No acute intracranial abnormality 2. Advanced cervical spondylosis.  Negative for fracture. Electronically Signed   By: Franchot Gallo M.D.   On: 01/20/2021 13:17   US PELVIS (TRANSABDOMINAL ONLY)  Result Date: 01/25/2021 CLINICAL DATA:  Ovarian mass on CT EXAM: TRANSABDOMINAL ULTRASOUND OF PELVIS DOPPLER ULTRASOUND OF OVARIES TECHNIQUE: Transabdominal ultrasound examination of the  pelvis was performed including evaluation of the uterus, ovaries, adnexal regions, and pelvic cul-de-sac. Color and duplex Doppler ultrasound was utilized to evaluate blood flow to the ovaries. COMPARISON:  CT abdomen/pelvis dated 01/20/2021 FINDINGS: Uterus Measurements: 7.0 x 5.6 x 5.4 cm = volume: 112 mL. 2.5 x 2.1 x 1.9 cm intramural fibroid in the left anterior uterine body. Endometrium Thickness: 4 mm.  No focal abnormality visualized. Right ovary Measurements: 8.8 x 5.6 x 6.7 cm = volume: 173 mL. 7.0 x 4.4 x 4.8 cm cystic lesion with incomplete peripheral septation. No mural nodularity/solid component. Left ovary Not discretely visualized.  No adnexal mass is seen. Pulsed Doppler evaluation demonstrates normal low-resistance arterial and venous waveforms in both ovaries. Other: No pelvic ascites. IMPRESSION: 7.0 cm minimally complex cystic lesion with incomplete peripheral septation, but without overt malignant features. Given size, a benign ovarian neoplasm is favored. However, at the patient's age, it is unclear that aggressive management is required. Consider follow-up pelvic ultrasound in 1 year if clinically warranted. Left ovary is not discretely visualized. No evidence of right ovarian torsion. Electronically Signed   By: Julian Hy M.D.   On: 01/25/2021 21:56   DG Chest Port 1 View  Result Date: 01/27/2021 CLINICAL DATA:  Fever and altered mental status. EXAM: PORTABLE CHEST 1 VIEW COMPARISON:  01/22/2021 FINDINGS: 0807 hours. Lungs are hyperexpanded. Bibasilar collapse/consolidation, similar to prior with bilateral small pleural effusions, stable. The cardiopericardial silhouette is within normal limits for size. Bones are diffusely demineralized. IMPRESSION: Stable. Hyperexpansion with bibasilar atelectasis/infiltrate and small bilateral pleural effusions. Electronically Signed   By: Misty Stanley M.D.   On: 01/27/2021 08:28   DG Chest Port 1 View  Result Date: 01/22/2021 CLINICAL  DATA:  Shortness of breath EXAM: PORTABLE CHEST 1 VIEW COMPARISON:  January 20, 2021 FINDINGS: Left central line is stable. The ETT is been removed. No pneumothorax. Bilateral layering effusions with underlying atelectasis or not seen previously. The NG tube is been removed in the interval. The cardiomediastinal silhouette is stable. No other changes. IMPRESSION: 1. Support apparatus as above. 2. Bilateral layering pleural effusions with underlying opacities, likely atelectasis, not seen previously. 3. No other acute abnormalities. Electronically Signed   By: Dorise Bullion III M.D.   On: 01/22/2021 20:44   DG Chest Port 1 View  Result Date: 01/20/2021 CLINICAL DATA:  Central line placement, intubated EXAM: PORTABLE CHEST 1 VIEW COMPARISON:  01/20/2021 FINDINGS: Single frontal view of the chest demonstrates endotracheal tube overlying tracheal air column tip at level of thoracic inlet. Enteric catheter passes below diaphragm tip excluded by collimation. Left internal jugular catheter tip overlies superior vena cava. Cardiac silhouette is unremarkable. No airspace disease, effusion, or pneumothorax. Chronic scarring. Mild central vascular congestion has developed since prior study. IMPRESSION: 1. Support devices as above. 2. Mild central vascular congestion. 3. No acute airspace disease. Electronically Signed   By: Randa Ngo M.D.   On: 01/20/2021 18:57   CT Renal Stone Study  Result Date: 01/20/2021 CLINICAL DATA:  Flank pain, confusion, code sepsis EXAM: CT ABDOMEN AND PELVIS WITHOUT  CONTRAST TECHNIQUE: Multidetector CT imaging of the abdomen and pelvis was performed following the standard protocol without IV contrast. COMPARISON:  03/31/2007 FINDINGS: Lower chest: No acute abnormality. Hepatobiliary: No solid liver abnormality is seen. Multiple small gallstones in the dependent gallbladder. No gallbladder wall thickening. There is profound periportal edema and or intrahepatic biliary ductal dilatation  (series 2, image 24). No calculus or other obstructing etiology identified to the ampulla. Pancreas: Unremarkable. No pancreatic ductal dilatation or surrounding inflammatory changes. Spleen: Normal in size without significant abnormality. Adrenals/Urinary Tract: Adrenal glands are unremarkable. There is a 0.8 cm calculus present near the left ureteropelvic junction, with moderate associated left hydronephrosis and hydroureter (series 5, image 57). Left-sided perinephric fat stranding. There are multiple additional bilateral nonobstructive renal calculi. Bladder is unremarkable. Stomach/Bowel: Stomach is within normal limits. Appendix is not clearly visualized. No evidence of bowel wall thickening, distention, or inflammatory changes. Sigmoid diverticulosis. Large stool ball in the rectum. Vascular/Lymphatic: Aortic atherosclerosis. No enlarged abdominal or pelvic lymph nodes. Reproductive: Cystic lesion of the right ovary with a thick internal septation, measuring 7.2 x 5.4 cm, significantly increased in size compared to remote prior examination dated 2008 (series 2, image 58). Other: No abdominal wall hernia or abnormality. No abdominopelvic ascites. Musculoskeletal: No acute or significant osseous findings. Status post left hip total arthroplasty. IMPRESSION: 1. There is a 0.8 cm calculus present near the left ureteropelvic junction, with moderate associated left hydronephrosis and hydroureter. Left-sided perinephric fat stranding. 2. There are multiple additional bilateral nonobstructive renal calculi. 3. There is profound periportal edema and or intrahepatic biliary ductal dilatation. No calculus or other obstructing etiology identified to the ampulla. Correlate with biochemical evidence of biliary obstruction and consider MRI/MRCP to further evaluate if indicated by concern for biliary obstruction. 4. Cholelithiasis. 5. Cystic lesion of the right ovary with a thick internal septation, measuring 7.2 x 5.4 cm,  significantly increased in size compared to remote prior examination dated 2008. 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 Because this lesion is not adequately characterized, prompt, although nonemergent [place] is recommended for further evaluation. Note: This recommendation does not apply to premenarchal patients and to those with increased risk (genetic, family history, elevated tumor markers or other high-risk factors) of ovarian cancer. Reference: JACR 2020 Feb; 17(2):248-254 6. Large stool ball in the rectum.  Correlate for impaction. Aortic Atherosclerosis (ICD10-I70.0). Electronically Signed   By: Eddie Candle M.D.   On: 01/20/2021 13:18   US ABDOMINAL PELVIC ART/VENT FLOW DOPPLER LIMITED  Result Date: 01/25/2021 CLINICAL DATA:  Ovarian mass on CT EXAM: TRANSABDOMINAL ULTRASOUND OF PELVIS DOPPLER ULTRASOUND OF OVARIES TECHNIQUE: Transabdominal ultrasound examination of the pelvis was performed including evaluation of the uterus, ovaries, adnexal regions, and pelvic cul-de-sac. Color and duplex Doppler ultrasound was utilized to evaluate blood flow to the ovaries. COMPARISON:  CT abdomen/pelvis dated 01/20/2021 FINDINGS: Uterus Measurements: 7.0 x 5.6 x 5.4 cm = volume: 112 mL. 2.5 x 2.1 x 1.9 cm intramural fibroid in the left anterior uterine body. Endometrium Thickness: 4 mm.  No focal abnormality visualized. Right ovary Measurements: 8.8 x 5.6 x 6.7 cm = volume: 173 mL. 7.0 x 4.4 x 4.8 cm cystic lesion with incomplete peripheral septation. No mural nodularity/solid component. Left ovary Not discretely visualized.  No adnexal mass is seen. Pulsed Doppler evaluation demonstrates normal low-resistance arterial and venous waveforms in both ovaries. Other: No pelvic ascites. IMPRESSION: 7.0 cm minimally complex cystic lesion with incomplete peripheral septation, but without overt malignant  features. Given size, a benign ovarian neoplasm is favored. However, at the  patient's age, it is unclear that aggressive management is required. Consider follow-up pelvic ultrasound in 1 year if clinically warranted. Left ovary is not discretely visualized. No evidence of right ovarian torsion. Electronically Signed   By: Julian Hy M.D.   On: 01/25/2021 21:56      Subjective: No sob, cp, or dizziness  Discharge Exam: Vitals:   01/27/21 0900 01/27/21 1219  BP: (!) 141/84 (!) 157/95  Pulse: 91 98  Resp: 18 18  Temp: 97.7 F (36.5 C) 98.8 F (37.1 C)  SpO2: 96% 95%   Vitals:   01/27/21 0500 01/27/21 0717 01/27/21 0900 01/27/21 1219  BP:  (!) 180/83 (!) 141/84 (!) 157/95  Pulse:  99 91 98  Resp:  _0 Temp:  98.2 F (36.8 C) 97.7 F (36.5 C) 98.8 F (37.1 C)  TempSrc:  Oral Oral   SpO2:  96% 96% 95%  Weight: 56.3 kg     Height:        General: Pt is alert, awake, not in acute distress Cardiovascular: RRR, S1/S2 +, no rubs, no gallops Respiratory: CTA bilaterally, no wheezing, no rhonchi Abdominal: Soft, NT, ND, bowel sounds + Extremities: no edema   The results of significant diagnostics from this hospitalization (including imaging, microbiology, ancillary and laboratory) are listed below for reference.     Microbiology: Recent Results (from the past 240 hour(s))  Culture, blood (Routine x 2)     Status: None   Collection Time: 01/20/21 11:42 AM   Specimen: BLOOD  Result Value Ref Range Status   Specimen Description BLOOD BLOOD RIGHT FOREARM  Final   Special Requests   Final    BOTTLES DRAWN AEROBIC AND ANAEROBIC Blood Culture results may not be optimal due to an inadequate volume of blood received in culture bottles   Culture   Final    NO GROWTH 5 DAYS Performed at [facility], [address] [place], [place] [address]    Report Status 01/25/2021 FINAL  Final  Culture, blood (Routine x 2)     Status: Abnormal   Collection Time: 01/20/21 11:42 AM   Specimen: BLOOD  Result Value Ref Range Status   Specimen  Description   Final    BLOOD LEFT ANTECUBITAL Performed at [facility], [address], [place], [place] [address]    Special Requests   Final    BOTTLES DRAWN AEROBIC AND ANAEROBIC Blood Culture results may not be optimal due to an inadequate volume of blood received in culture bottles Performed at [facility], [address], [place], [place] [address]    Culture  Setup Time   Final    GRAM NEGATIVE RODS ANAEROBIC BOTTLE ONLY CRITICAL RESULT CALLED TO, READ BACK BY AND VERIFIED WITH: CAROLINE CHILDS AT 1129 01/21/21.PMF Performed at [facility], [place] [address], [place], [place] [address]    Culture ESCHERICHIA COLI (A)  Final   Report Status 01/23/2021 FINAL  Final   Organism ID, Bacteria ESCHERICHIA COLI  Final      Susceptibility   Escherichia coli - MIC*    AMPICILLIN 8 SENSITIVE Sensitive     CEFAZOLIN <=4 SENSITIVE Sensitive     CEFEPIME <=0.12 SENSITIVE Sensitive     CEFTAZIDIME <=1 SENSITIVE Sensitive     CEFTRIAXONE <=0.25 SENSITIVE Sensitive     CIPROFLOXACIN <=0.25 SENSITIVE Sensitive     GENTAMICIN <=1 SENSITIVE Sensitive  IMIPENEM 1 SENSITIVE Sensitive     TRIMETH/SULFA <=20 SENSITIVE Sensitive     AMPICILLIN/SULBACTAM <=2 SENSITIVE Sensitive     PIP/TAZO <=4 SENSITIVE Sensitive     * ESCHERICHIA COLI  Urine Culture     Status: Abnormal   Collection Time: 01/20/21 11:42 AM   Specimen: Urine, Clean Catch  Result Value Ref Range Status   Specimen Description   Final    URINE, CLEAN CATCH Performed at [facility], [address] [place], [place] [address]    Special Requests   Final    NONE Performed at [facility], [address] [place], [place] [address]    Culture >=100,000 COLONIES/mL ESCHERICHIA COLI (A)  Final   Report Status 01/23/2021 FINAL  Final   Organism ID, Bacteria ESCHERICHIA COLI (A)  Final      Susceptibility   Escherichia coli - MIC*    AMPICILLIN <=2 SENSITIVE Sensitive     CEFAZOLIN  <=4 SENSITIVE Sensitive     CEFEPIME <=0.12 SENSITIVE Sensitive     CEFTRIAXONE <=0.25 SENSITIVE Sensitive     CIPROFLOXACIN <=0.25 SENSITIVE Sensitive     GENTAMICIN <=1 SENSITIVE Sensitive     IMIPENEM <=0.25 SENSITIVE Sensitive     NITROFURANTOIN <=16 SENSITIVE Sensitive     TRIMETH/SULFA <=20 SENSITIVE Sensitive     AMPICILLIN/SULBACTAM <=2 SENSITIVE Sensitive     PIP/TAZO <=4 SENSITIVE Sensitive     * >=100,000 COLONIES/mL ESCHERICHIA COLI  Blood Culture ID Panel (Reflexed)     Status: Abnormal   Collection Time: 01/20/21 11:42 AM  Result Value Ref Range Status   Enterococcus faecalis NOT DETECTED NOT DETECTED Final   Enterococcus Faecium NOT DETECTED NOT DETECTED Final   Listeria monocytogenes NOT DETECTED NOT DETECTED Final   Staphylococcus species NOT DETECTED NOT DETECTED Final   Staphylococcus aureus (BCID) NOT DETECTED NOT DETECTED Final   Staphylococcus epidermidis NOT DETECTED NOT DETECTED Final   Staphylococcus lugdunensis NOT DETECTED NOT DETECTED Final   Streptococcus species NOT DETECTED NOT DETECTED Final   Streptococcus agalactiae NOT DETECTED NOT DETECTED Final   Streptococcus pneumoniae NOT DETECTED NOT DETECTED Final   Streptococcus pyogenes NOT DETECTED NOT DETECTED Final   A.calcoaceticus-baumannii NOT DETECTED NOT DETECTED Final   Bacteroides fragilis NOT DETECTED NOT DETECTED Final   Enterobacterales DETECTED (A) NOT DETECTED Final    Comment: Enterobacterales represent a large order of gram negative bacteria, not a single organism. CRITICAL RESULT CALLED TO, READ BACK BY AND VERIFIED WITH: CAROLINE CHILDS AT 1129 01/21/21.PMF    Enterobacter cloacae complex NOT DETECTED NOT DETECTED Final   Escherichia coli DETECTED (A) NOT DETECTED Final    Comment: CRITICAL RESULT CALLED TO, READ BACK BY AND VERIFIED WITH: CAROLINE CHILDS AT 1129 01/21/21.PMF    Klebsiella aerogenes NOT DETECTED NOT DETECTED Final   Klebsiella oxytoca NOT DETECTED NOT DETECTED Final    Klebsiella pneumoniae NOT DETECTED NOT DETECTED Final   Proteus species NOT DETECTED NOT DETECTED Final   Salmonella species NOT DETECTED NOT DETECTED Final   Serratia marcescens NOT DETECTED NOT DETECTED Final   Haemophilus influenzae NOT DETECTED NOT DETECTED Final   Neisseria meningitidis NOT DETECTED NOT DETECTED Final   Pseudomonas aeruginosa NOT DETECTED NOT DETECTED Final   Stenotrophomonas maltophilia NOT DETECTED NOT DETECTED Final   Candida albicans NOT DETECTED NOT DETECTED Final   Candida auris NOT DETECTED NOT DETECTED Final   Candida glabrata NOT DETECTED NOT DETECTED Final   Candida krusei NOT DETECTED NOT DETECTED Final  Candida parapsilosis NOT DETECTED NOT DETECTED Final   Candida tropicalis NOT DETECTED NOT DETECTED Final   Cryptococcus neoformans/gattii NOT DETECTED NOT DETECTED Final   CTX-M ESBL NOT DETECTED NOT DETECTED Final   Carbapenem resistance IMP NOT DETECTED NOT DETECTED Final   Carbapenem resistance KPC NOT DETECTED NOT DETECTED Final   Carbapenem resistance NDM NOT DETECTED NOT DETECTED Final   Carbapenem resist OXA 48 LIKE NOT DETECTED NOT DETECTED Final   Carbapenem resistance VIM NOT DETECTED NOT DETECTED Final    Comment: Performed at [facility], [address] [place], [place] [address]  Resp Panel by RT-PCR (Flu A&B, Covid) Nasopharyngeal Swab     Status: None   Collection Time: 01/20/21 12:21 PM   Specimen: Nasopharyngeal Swab; Nasopharyngeal(NP) swabs in vial transport medium  Result Value Ref Range Status   SARS Coronavirus 2 by RT PCR NEGATIVE NEGATIVE Final    Comment: (NOTE) SARS-CoV-2 target nucleic acids are NOT DETECTED.  The SARS-CoV-2 RNA is generally detectable in upper respiratory specimens during the acute phase of infection. The lowest concentration of SARS-CoV-2 viral copies this assay can detect is 138 copies/mL. A negative result does not preclude SARS-Cov-2 infection and should not be used as the sole basis for  treatment or other patient management decisions. A negative result may occur with  improper specimen collection/handling, submission of specimen other than nasopharyngeal swab, presence of viral mutation(s) within the areas targeted by this assay, and inadequate number of viral copies(<138 copies/mL). A negative result must be combined with clinical observations, patient history, and epidemiological information. The expected result is Negative.  Fact Sheet for Patients:  EntrepreneurPulse.com.au  Fact Sheet for Healthcare Providers:  IncredibleEmployment.be  This test is no t yet approved or cleared by the [place] FDA and  has been authorized for detection and/or diagnosis of SARS-CoV-2 by FDA under an Emergency Use Authorization (EUA). This EUA will remain  in effect (meaning this test can be used) for the duration of the COVID-19 declaration under Section 564(b)(1) of the Act, 21 U.S.C.section 360bbb-3(b)(1), unless the authorization is terminated  or revoked sooner.       Influenza A by PCR NEGATIVE NEGATIVE Final   Influenza B by PCR NEGATIVE NEGATIVE Final    Comment: (NOTE) The Xpert Xpress SARS-CoV-2/FLU/RSV plus assay is intended as an aid in the diagnosis of influenza from Nasopharyngeal swab specimens and should not be used as a sole basis for treatment. Nasal washings and aspirates are unacceptable for Xpert Xpress SARS-CoV-2/FLU/RSV testing.  Fact Sheet for Patients: EntrepreneurPulse.com.au  Fact Sheet for Healthcare Providers: IncredibleEmployment.be  This test is not yet approved or cleared by the [place] FDA and has been authorized for detection and/or diagnosis of SARS-CoV-2 by FDA under an Emergency Use Authorization (EUA). This EUA will remain in effect (meaning this test can be used) for the duration of the COVID-19 declaration under Section 564(b)(1) of the Act, 21  U.S.C. section 360bbb-3(b)(1), unless the authorization is terminated or revoked.  Performed at [facility], [address] [place],  [address]   MRSA Next Gen by PCR, Nasal     Status: None   Collection Time: 01/20/21  5:03 PM   Specimen: Nasal Mucosa; Nasal Swab  Result Value Ref Range Status   MRSA by PCR Next Gen NOT DETECTED NOT DETECTED Final    Comment: (NOTE) The GeneXpert MRSA Assay (FDA approved for NASAL specimens only), is one component of a comprehensive MRSA colonization surveillance program. It is  not intended to diagnose MRSA infection nor to guide or monitor treatment for MRSA infections. Test performance is not FDA approved in patients less than 39 years old. Performed at [facility], [address] [place], [place] [address]   Resp Panel by RT-PCR (Flu A&B, Covid) Nasopharyngeal Swab     Status: None   Collection Time: 01/26/21  4:03 PM   Specimen: Nasopharyngeal Swab; Nasopharyngeal(NP) swabs in vial transport medium  Result Value Ref Range Status   SARS Coronavirus 2 by RT PCR NEGATIVE NEGATIVE Final    Comment: (NOTE) SARS-CoV-2 target nucleic acids are NOT DETECTED.  The SARS-CoV-2 RNA is generally detectable in upper respiratory specimens during the acute phase of infection. The lowest concentration of SARS-CoV-2 viral copies this assay can detect is 138 copies/mL. A negative result does not preclude SARS-Cov-2 infection and should not be used as the sole basis for treatment or other patient management decisions. A negative result may occur with  improper specimen collection/handling, submission of specimen other than nasopharyngeal swab, presence of viral mutation(s) within the areas targeted by this assay, and inadequate number of viral copies(<138 copies/mL). A negative result must be combined with clinical observations, patient history, and epidemiological information. The expected result is Negative.  Fact Sheet for  Patients:  EntrepreneurPulse.com.au  Fact Sheet for Healthcare Providers:  IncredibleEmployment.be  This test is no t yet approved or cleared by the [place] FDA and  has been authorized for detection and/or diagnosis of SARS-CoV-2 by FDA under an Emergency Use Authorization (EUA). This EUA will remain  in effect (meaning this test can be used) for the duration of the COVID-19 declaration under Section 564(b)(1) of the Act, 21 U.S.C.section 360bbb-3(b)(1), unless the authorization is terminated  or revoked sooner.       Influenza A by PCR NEGATIVE NEGATIVE Final   Influenza B by PCR NEGATIVE NEGATIVE Final    Comment: (NOTE) The Xpert Xpress SARS-CoV-2/FLU/RSV plus assay is intended as an aid in the diagnosis of influenza from Nasopharyngeal swab specimens and should not be used as a sole basis for treatment. Nasal washings and aspirates are unacceptable for Xpert Xpress SARS-CoV-2/FLU/RSV testing.  Fact Sheet for Patients: EntrepreneurPulse.com.au  Fact Sheet for Healthcare Providers: IncredibleEmployment.be  This test is not yet approved or cleared by the [place] FDA and has been authorized for detection and/or diagnosis of SARS-CoV-2 by FDA under an Emergency Use Authorization (EUA). This EUA will remain in effect (meaning this test can be used) for the duration of the COVID-19 declaration under Section 564(b)(1) of the Act, 21 U.S.C. section 360bbb-3(b)(1), unless the authorization is terminated or revoked.  Performed at [facility], [address] [place], [place] [address]      Labs: BNP (last 3 results) No results for input(s): BNP in the last 8760 hours. Basic Metabolic Panel: Recent Labs  Lab 01/21/21 0417 01/22/21 0615 01/23/21 0459 01/24/21 0423 01/25/21 0418 01/26/21 0314 01/27/21 0345  NA 137 138 137 141 139  --   --   K 3.3* 4.1 3.0* 3.0* 3.7  --   --    CL 109 106 102 102 103  --   --   CO2 21* _0 --   --   GLUCOSE 179* 101* 78 97 120*  --   --   BUN 31* 31* 23 26* 22  --   --   CREATININE 0.98 0.85 0.83 0.93 0.77  --   --   CALCIUM  7.0* 7.1* 6.9* 7.5* 7.6*  --   --   MG 2.4 2.0 1.9 1.9 1.9 1.8 2.0  PHOS 3.3 2.7 2.4* 2.1* 2.8 2.9 2.9   Liver Function Tests: Recent Labs  Lab 01/21/21 0417 01/22/21 0615 01/23/21 0459  AST 35 26 23  ALT _0 ALKPHOS 45 66 74  BILITOT 0.9 0.9 1.1  PROT 4.1* 4.1* 4.2*  ALBUMIN 2.3* 2.1* 2.3*   No results for input(s): LIPASE, AMYLASE in the last 168 hours. No results for input(s): AMMONIA in the last 168 hours. CBC: Recent Labs  Lab 01/21/21 0417 01/22/21 0615 01/23/21 0459 01/24/21 0423 01/25/21 0418 01/27/21 0758  WBC 47.1* 20.4* 23.8* 19.6* 10.6* 14.9*  NEUTROABS 42.2* 16.5* 22.1* 17.6* 8.3*  --   HGB 11.8* 10.7* 10.7* 10.9* 11.6* 12.2  HCT 35.3* 31.4* 32.0* 33.8* 34.5* 37.5  MCV 93.6 90.8 91.4 94.2 92.7 91.7  PLT 168 62* 53* 65* 79* 144*   Cardiac Enzymes: No results for input(s): CKTOTAL, CKMB, CKMBINDEX, TROPONINI in the last 168 hours. BNP: Invalid input(s): POCBNP CBG: Recent Labs  Lab 01/22/21 2339 01/23/21 0458 01/23/21 0803 01/26/21 1652 01/26/21 2226  GLUCAP 99 79 77 118* 148*   D-Dimer No results for input(s): DDIMER in the last 72 hours. Hgb A1c No results for input(s): HGBA1C in the last 72 hours. Lipid Profile No results for input(s): CHOL, HDL, LDLCALC, TRIG, CHOLHDL, LDLDIRECT in the last 72 hours. Thyroid function studies No results for input(s): TSH, T4TOTAL, T3FREE, THYROIDAB in the last 72 hours.  Invalid input(s): FREET3 Anemia work up No results for input(s): VITAMINB12, FOLATE, FERRITIN, TIBC, IRON, RETICCTPCT in the last 72 hours. Urinalysis    Component Value Date/Time   COLORURINE ORANGE (A) 01/20/2021 1142   APPEARANCEUR CLOUDY (A) 01/20/2021 1142   APPEARANCEUR Clear 12/19/2012 1653   LABSPEC 1.013 01/20/2021 1142    LABSPEC 1.013 12/19/2012 1653   PHURINE 5.0 01/20/2021 1142   GLUCOSEU NEGATIVE 01/20/2021 1142   GLUCOSEU Negative 12/19/2012 1653   HGBUR LARGE (A) 01/20/2021 1142   BILIRUBINUR NEGATIVE 01/20/2021 1142   BILIRUBINUR Negative 12/19/2012 [place] 01/20/2021 1142   PROTEINUR 100 (A) 01/20/2021 1142   NITRITE NEGATIVE 01/20/2021 1142   LEUKOCYTESUR SMALL (A) 01/20/2021 1142   LEUKOCYTESUR Negative 12/19/2012 1653   Sepsis Labs Invalid input(s): PROCALCITONIN,  WBC,  LACTICIDVEN Microbiology Recent Results (from the past 240 hour(s))  Culture, blood (Routine x 2)     Status: None   Collection Time: 01/20/21 11:42 AM   Specimen: BLOOD  Result Value Ref Range Status   Specimen Description BLOOD BLOOD RIGHT FOREARM  Final   Special Requests   Final    BOTTLES DRAWN AEROBIC AND ANAEROBIC Blood Culture results may not be optimal due to an inadequate volume of blood received in culture bottles   Culture   Final    NO GROWTH 5 DAYS Performed at [facility], [address], [place], [place] [address]    Report Status 01/25/2021 FINAL  Final  Culture, blood (Routine x 2)     Status: Abnormal   Collection Time: 01/20/21 11:42 AM   Specimen: BLOOD  Result Value Ref Range Status   Specimen Description   Final    BLOOD LEFT ANTECUBITAL Performed at [facility], [address] [place], [place] [address]    Special Requests   Final    BOTTLES DRAWN AEROBIC AND ANAEROBIC Blood Culture results may not be optimal due to  an inadequate volume of blood received in culture bottles Performed at [facility], [address] [place], [place] [address]    Culture  Setup Time   Final    GRAM NEGATIVE RODS ANAEROBIC BOTTLE ONLY CRITICAL RESULT CALLED TO, READ BACK BY AND VERIFIED WITH: CAROLINE CHILDS AT 1129 01/21/21.PMF Performed at [facility], [place] [address] [place], [place] [address]    Culture ESCHERICHIA COLI (A)  Final   Report  Status 01/23/2021 FINAL  Final   Organism ID, Bacteria ESCHERICHIA COLI  Final      Susceptibility   Escherichia coli - MIC*    AMPICILLIN 8 SENSITIVE Sensitive     CEFAZOLIN <=4 SENSITIVE Sensitive     CEFEPIME <=0.12 SENSITIVE Sensitive     CEFTAZIDIME <=1 SENSITIVE Sensitive     CEFTRIAXONE <=0.25 SENSITIVE Sensitive     CIPROFLOXACIN <=0.25 SENSITIVE Sensitive     GENTAMICIN <=1 SENSITIVE Sensitive     IMIPENEM 1 SENSITIVE Sensitive     TRIMETH/SULFA <=20 SENSITIVE Sensitive     AMPICILLIN/SULBACTAM <=2 SENSITIVE Sensitive     PIP/TAZO <=4 SENSITIVE Sensitive     * ESCHERICHIA COLI  Urine Culture     Status: Abnormal   Collection Time: 01/20/21 11:42 AM   Specimen: Urine, Clean Catch  Result Value Ref Range Status   Specimen Description   Final    URINE, CLEAN CATCH Performed at [facility], [address], [place], [place] [address]    Special Requests   Final    NONE Performed at [facility], [address] [place],  [address]    Culture >=100,000 COLONIES/mL ESCHERICHIA COLI (A)  Final   Report Status 01/23/2021 FINAL  Final   Organism ID, Bacteria ESCHERICHIA COLI (A)  Final      Susceptibility   Escherichia coli - MIC*    AMPICILLIN <=2 SENSITIVE Sensitive     CEFAZOLIN <=4 SENSITIVE Sensitive     CEFEPIME <=0.12 SENSITIVE Sensitive     CEFTRIAXONE <=0.25 SENSITIVE Sensitive     CIPROFLOXACIN <=0.25 SENSITIVE Sensitive     GENTAMICIN <=1 SENSITIVE Sensitive     IMIPENEM <=0.25 SENSITIVE Sensitive     NITROFURANTOIN <=16 SENSITIVE Sensitive     TRIMETH/SULFA <=20 SENSITIVE Sensitive     AMPICILLIN/SULBACTAM <=2 SENSITIVE Sensitive     PIP/TAZO <=4 SENSITIVE Sensitive     * >=100,000 COLONIES/mL ESCHERICHIA COLI  Blood Culture ID Panel (Reflexed)     Status: Abnormal   Collection Time: 01/20/21 11:42 AM  Result Value Ref Range Status   Enterococcus faecalis NOT DETECTED NOT DETECTED Final   Enterococcus Faecium NOT DETECTED NOT  DETECTED Final   Listeria monocytogenes NOT DETECTED NOT DETECTED Final   Staphylococcus species NOT DETECTED NOT DETECTED Final   Staphylococcus aureus (BCID) NOT DETECTED NOT DETECTED Final   Staphylococcus epidermidis NOT DETECTED NOT DETECTED Final   Staphylococcus lugdunensis NOT DETECTED NOT DETECTED Final   Streptococcus species NOT DETECTED NOT DETECTED Final   Streptococcus agalactiae NOT DETECTED NOT DETECTED Final   Streptococcus pneumoniae NOT DETECTED NOT DETECTED Final   Streptococcus pyogenes NOT DETECTED NOT DETECTED Final   A.calcoaceticus-baumannii NOT DETECTED NOT DETECTED Final   Bacteroides fragilis NOT DETECTED NOT DETECTED Final   Enterobacterales DETECTED (A) NOT DETECTED Final    Comment: Enterobacterales represent a large order of gram negative bacteria, not a single organism. CRITICAL RESULT CALLED TO, READ BACK BY AND VERIFIED WITH: CAROLINE CHILDS AT 1129 01/21/21.PMF  Enterobacter cloacae complex NOT DETECTED NOT DETECTED Final   Escherichia coli DETECTED (A) NOT DETECTED Final    Comment: CRITICAL RESULT CALLED TO, READ BACK BY AND VERIFIED WITH: CAROLINE CHILDS AT 1129 01/21/21.PMF    Klebsiella aerogenes NOT DETECTED NOT DETECTED Final   Klebsiella oxytoca NOT DETECTED NOT DETECTED Final   Klebsiella pneumoniae NOT DETECTED NOT DETECTED Final   Proteus species NOT DETECTED NOT DETECTED Final   Salmonella species NOT DETECTED NOT DETECTED Final   Serratia marcescens NOT DETECTED NOT DETECTED Final   Haemophilus influenzae NOT DETECTED NOT DETECTED Final   Neisseria meningitidis NOT DETECTED NOT DETECTED Final   Pseudomonas aeruginosa NOT DETECTED NOT DETECTED Final   Stenotrophomonas maltophilia NOT DETECTED NOT DETECTED Final   Candida albicans NOT DETECTED NOT DETECTED Final   Candida auris NOT DETECTED NOT DETECTED Final   Candida glabrata NOT DETECTED NOT DETECTED Final   Candida krusei NOT DETECTED NOT DETECTED Final   Candida parapsilosis NOT  DETECTED NOT DETECTED Final   Candida tropicalis NOT DETECTED NOT DETECTED Final   Cryptococcus neoformans/gattii NOT DETECTED NOT DETECTED Final   CTX-M ESBL NOT DETECTED NOT DETECTED Final   Carbapenem resistance IMP NOT DETECTED NOT DETECTED Final   Carbapenem resistance KPC NOT DETECTED NOT DETECTED Final   Carbapenem resistance NDM NOT DETECTED NOT DETECTED Final   Carbapenem resist OXA 48 LIKE NOT DETECTED NOT DETECTED Final   Carbapenem resistance VIM NOT DETECTED NOT DETECTED Final    Comment: Performed at [facility], [address] [place], [place] [address]  Resp Panel by RT-PCR (Flu A&B, Covid) Nasopharyngeal Swab     Status: None   Collection Time: 01/20/21 12:21 PM   Specimen: Nasopharyngeal Swab; Nasopharyngeal(NP) swabs in vial transport medium  Result Value Ref Range Status   SARS Coronavirus 2 by RT PCR NEGATIVE NEGATIVE Final    Comment: (NOTE) SARS-CoV-2 target nucleic acids are NOT DETECTED.  The SARS-CoV-2 RNA is generally detectable in upper respiratory specimens during the acute phase of infection. The lowest concentration of SARS-CoV-2 viral copies this assay can detect is 138 copies/mL. A negative result does not preclude SARS-Cov-2 infection and should not be used as the sole basis for treatment or other patient management decisions. A negative result may occur with  improper specimen collection/handling, submission of specimen other than nasopharyngeal swab, presence of viral mutation(s) within the areas targeted by this assay, and inadequate number of viral copies(<138 copies/mL). A negative result must be combined with clinical observations, patient history, and epidemiological information. The expected result is Negative.  Fact Sheet for Patients:  EntrepreneurPulse.com.au  Fact Sheet for Healthcare Providers:  IncredibleEmployment.be  This test is no t yet approved or cleared by the [place] FDA  and  has been authorized for detection and/or diagnosis of SARS-CoV-2 by FDA under an Emergency Use Authorization (EUA). This EUA will remain  in effect (meaning this test can be used) for the duration of the COVID-19 declaration under Section 564(b)(1) of the Act, 21 U.S.C.section 360bbb-3(b)(1), unless the authorization is terminated  or revoked sooner.       Influenza A by PCR NEGATIVE NEGATIVE Final   Influenza B by PCR NEGATIVE NEGATIVE Final    Comment: (NOTE) The Xpert Xpress SARS-CoV-2/FLU/RSV plus assay is intended as an aid in the diagnosis of influenza from Nasopharyngeal swab specimens and should not be used as a sole basis for treatment. Nasal washings and aspirates are unacceptable for Xpert Xpress SARS-CoV-2/FLU/RSV testing.  Fact Sheet  for Patients: EntrepreneurPulse.com.au  Fact Sheet for Healthcare Providers: IncredibleEmployment.be  This test is not yet approved or cleared by the [place] FDA and has been authorized for detection and/or diagnosis of SARS-CoV-2 by FDA under an Emergency Use Authorization (EUA). This EUA will remain in effect (meaning this test can be used) for the duration of the COVID-19 declaration under Section 564(b)(1) of the Act, 21 U.S.C. section 360bbb-3(b)(1), unless the authorization is terminated or revoked.  Performed at [facility], [address] [place], [place] [address]   MRSA Next Gen by PCR, Nasal     Status: None   Collection Time: 01/20/21  5:03 PM   Specimen: Nasal Mucosa; Nasal Swab  Result Value Ref Range Status   MRSA by PCR Next Gen NOT DETECTED NOT DETECTED Final    Comment: (NOTE) The GeneXpert MRSA Assay (FDA approved for NASAL specimens only), is one component of a comprehensive MRSA colonization surveillance program. It is not intended to diagnose MRSA infection nor to guide or monitor treatment for MRSA infections. Test performance is not FDA approved in  patients less than 45 years old. Performed at [facility], [address] [place], [place] [address]   Resp Panel by RT-PCR (Flu A&B, Covid) Nasopharyngeal Swab     Status: None   Collection Time: 01/26/21  4:03 PM   Specimen: Nasopharyngeal Swab; Nasopharyngeal(NP) swabs in vial transport medium  Result Value Ref Range Status   SARS Coronavirus 2 by RT PCR NEGATIVE NEGATIVE Final    Comment: (NOTE) SARS-CoV-2 target nucleic acids are NOT DETECTED.  The SARS-CoV-2 RNA is generally detectable in upper respiratory specimens during the acute phase of infection. The lowest concentration of SARS-CoV-2 viral copies this assay can detect is 138 copies/mL. A negative result does not preclude SARS-Cov-2 infection and should not be used as the sole basis for treatment or other patient management decisions. A negative result may occur with  improper specimen collection/handling, submission of specimen other than nasopharyngeal swab, presence of viral mutation(s) within the areas targeted by this assay, and inadequate number of viral copies(<138 copies/mL). A negative result must be combined with clinical observations, patient history, and epidemiological information. The expected result is Negative.  Fact Sheet for Patients:  EntrepreneurPulse.com.au  Fact Sheet for Healthcare Providers:  IncredibleEmployment.be  This test is no t yet approved or cleared by the [place] FDA and  has been authorized for detection and/or diagnosis of SARS-CoV-2 by FDA under an Emergency Use Authorization (EUA). This EUA will remain  in effect (meaning this test can be used) for the duration of the COVID-19 declaration under Section 564(b)(1) of the Act, 21 U.S.C.section 360bbb-3(b)(1), unless the authorization is terminated  or revoked sooner.       Influenza A by PCR NEGATIVE NEGATIVE Final   Influenza B by PCR NEGATIVE NEGATIVE Final    Comment:  (NOTE) The Xpert Xpress SARS-CoV-2/FLU/RSV plus assay is intended as an aid in the diagnosis of influenza from Nasopharyngeal swab specimens and should not be used as a sole basis for treatment. Nasal washings and aspirates are unacceptable for Xpert Xpress SARS-CoV-2/FLU/RSV testing.  Fact Sheet for Patients: EntrepreneurPulse.com.au  Fact Sheet for Healthcare Providers: IncredibleEmployment.be  This test is not yet approved or cleared by the [place] FDA and has been authorized for detection and/or diagnosis of SARS-CoV-2 by FDA under an Emergency Use Authorization (EUA). This EUA will remain in effect (meaning this test can be used) for the duration of the COVID-19  declaration under Section 564(b)(1) of the Act, 21 U.S.C. section 360bbb-3(b)(1), unless the authorization is terminated or revoked.  Performed at [facility], [address], [place], [place] [address]      Time coordinating discharge: Over 30 minutes  SIGNED:   Nolberto Hanlon, MD  [facility] 01/27/2021, 1:03 PM Pager   If 7PM-7AM, please contact night-coverage [email or web address] Password [email or web address]

## 2021-01-27 NOTE — TOC Progression Note (Signed)
Transition of Care [facility]) - Progression Note    Patient Details  Name: Lisa Gamble MRN: [id number] Date of Birth: [birth year]  [facility] CM/SW [place], RN Phone Number: 01/27/2021, 12:08 PM  Clinical Narrative:   Family has completed paperwork at [facility], EMS will transport patient at 2pm, barring emergency calls.  Patient, family, care team and facility aware of transfer.    Expected Discharge Plan: [place] Barriers to Discharge: Continued Medical Work up  Expected Discharge Plan and Services Expected Discharge Plan: [place] In-house Referral: Clinical Social Work   Post Acute Care Choice: [place] arrangements for the past 2 months: Apartment                 DME Arranged: N/A DME Agency: NA       HH Arranged: NA [place] Agency: NA         Social Determinants of Health (SDOH) Interventions    Readmission Risk Interventions No flowsheet data found.

## 2021-01-27 NOTE — Progress Notes (Signed)
Patient is being discharged to SNF, [facility].  Called report to [place], Therapist, sports.  Discharge papers placed in discharge envelope for receiving facility.  Awaiting EMS.

## 2021-01-27 NOTE — Progress Notes (Signed)
SLP Cancellation Note  Patient Details Name: SARAIA SHIRAR MRN: [id number] DOB: December 27, 1925   Cancelled treatment:       Reason Eval/Treat Not Completed: Patient declined, no reason specified (chart reviewed; consulted NSG). Per discussion w/ NSG, pt refused Breakfast meal this morning. Pt did accept her po meds in tsp of applesauce w/ NSG. Poor po intake was reported yesterday by [facility] staff as well.  Attempted offering several po's including different liquids, but pt refused/declined po's w/ this SLP was well despite encouragement to take something to drink to "wet your mouth". She instead perseverated on "getting out of here" and "I have that thing to blow into over there" referring to the incentive spirometer.  Recommend continue current dysphagia diet w/ aspiration precautions and Supervision for support at all meals. Recommend Dietician f/u for support also. Recommend a Palliative Care consult for [place] re: oral intake, desire for po's, swallowing, and aspiration risk w/ the aging pt and if any Cognitive decline. Recommend oral care for hygiene and stimulation of swallowing; encouragement to take po's frequently during the day.  ST services can be available if any further needs while admitted.      Orinda Kenner, MS, CCC-SLP Speech Language Pathologist Rehab Services [phone] [facility] 01/27/2021, 1:27 PM

## 2021-01-27 NOTE — Care Management Important Message (Signed)
Important Message  Patient Details  Name: Lisa Gamble MRN: [id number] Date of Birth: 1926/04/04   Medicare Important Message Given:  Yes     Dannette Barbara 01/27/2021, 11:17 AM

## 2021-02-02 ENCOUNTER — Telehealth: Payer: Self-pay | Admitting: Urology

## 2021-02-02 NOTE — Telephone Encounter (Signed)
S/W pt's son, Joneen Boers, who requested not to schedule an appt at this time.  Pt is currently in [facility] for the next 20 days and son isn't sure if she'll pull through or not.  He said pt can't walk right now more than a few steps and he wants to make sure he would be able to get her in for her appt.  He will c/b to schedule appt at a later date.

## 2021-03-06 LAB — BLOOD GAS, ARTERIAL
Acid-base deficit: 12.9 mmol/L — ABNORMAL HIGH (ref 0.0–2.0)
Bicarbonate: 13.3 mmol/L — ABNORMAL LOW (ref 20.0–28.0)
FIO2: 35
MECHVT: 450 mL
O2 Saturation: 96.8 %
PEEP: 5 cmH2O
Patient temperature: 37
RATE: 14 resp/min
pCO2 arterial: 31 mmHg — ABNORMAL LOW (ref 32.0–48.0)
pH, Arterial: 7.24 — ABNORMAL LOW (ref 7.350–7.450)
pO2, Arterial: 103 mmHg (ref 83.0–108.0)

## 2021-10-27 IMAGING — DX DG ABDOMEN 1V
2 series · 2 of 2 positions shown · non-contrast
Comparison: 01/20/2021

CLINICAL DATA: Abdominal pain

EXAM:
ABDOMEN - 1 VIEW

[abdomen supine (1 of 2)]
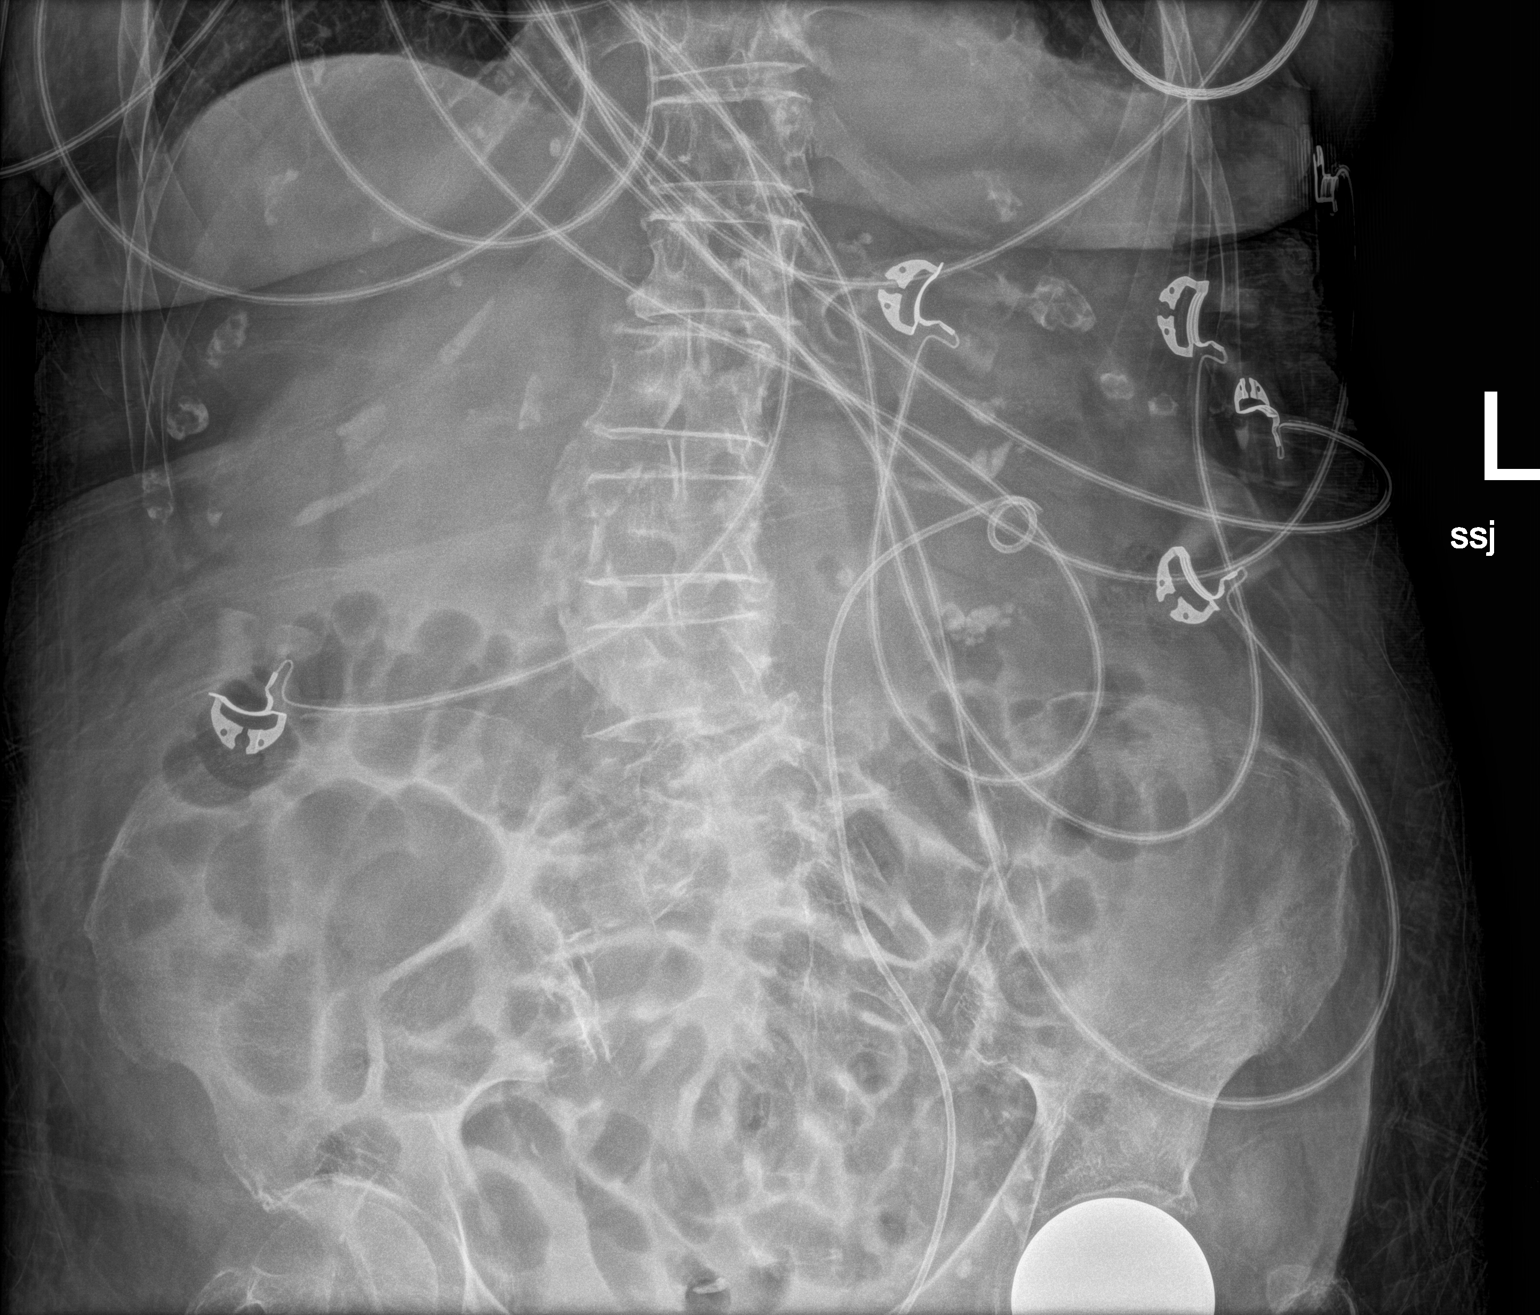

[abdomen supine (2 of 2)]
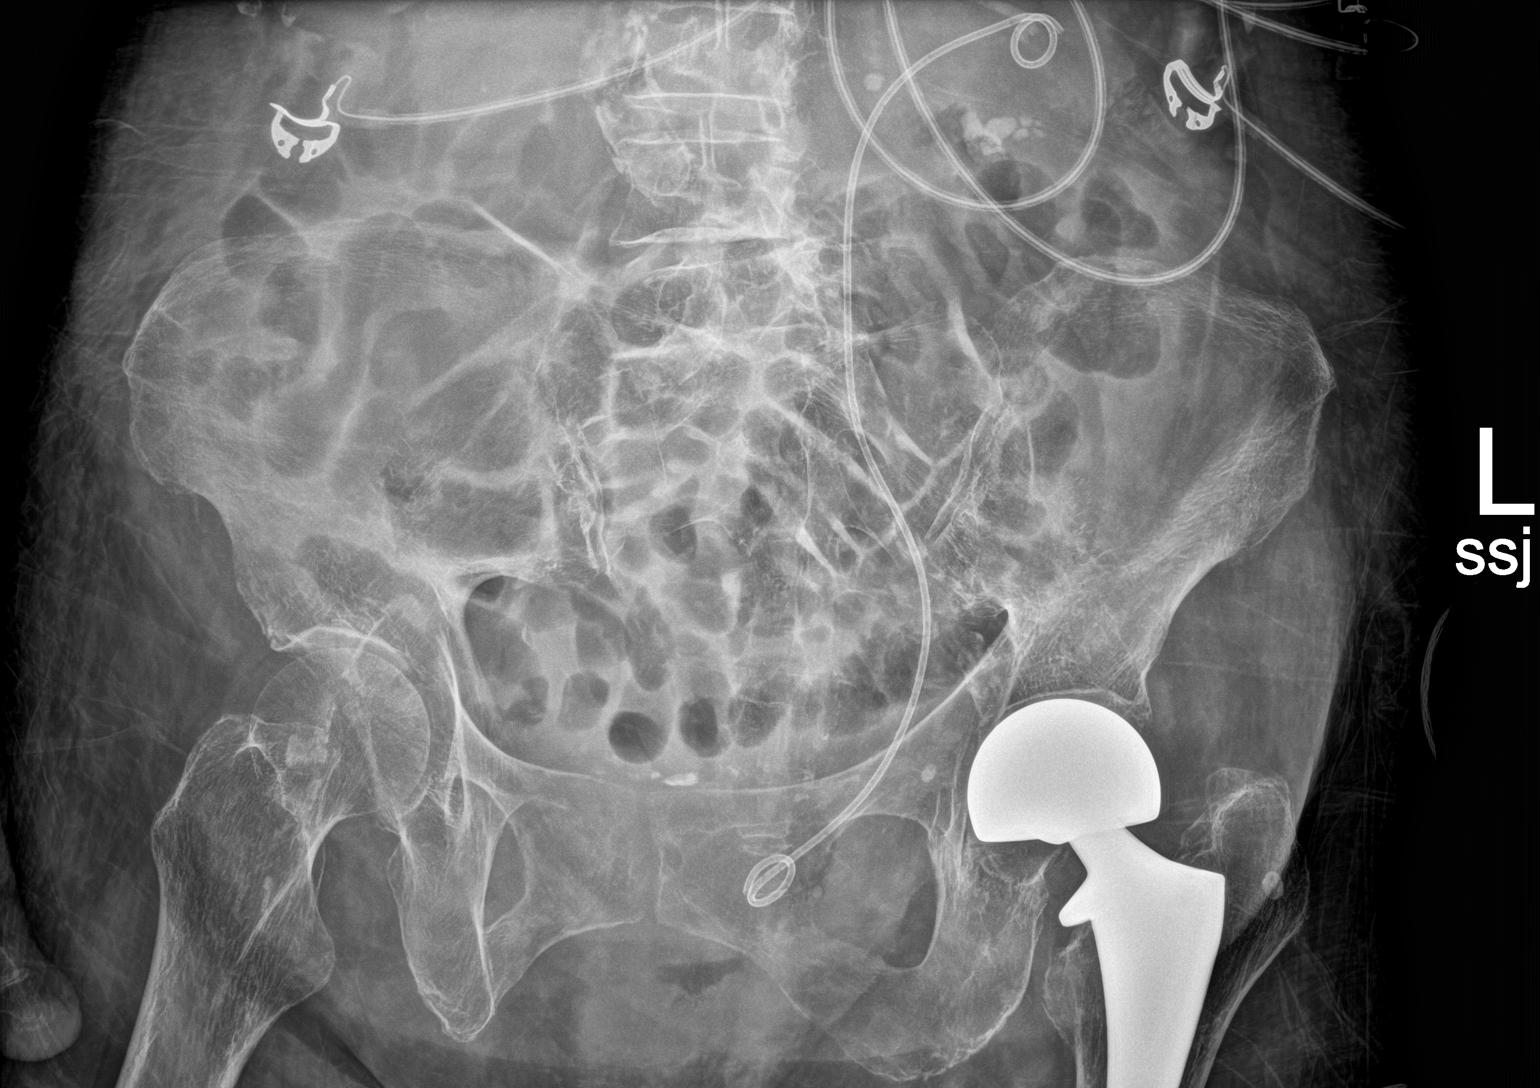

[2 of 2 positions shown; findings below may reference images not displayed]

FINDINGS: Left double-pigtail ureteral stent.

Nonobstructive bowel gas pattern.

Degenerative changes of the lumbar spine.  Vascular calcifications.

Left hip arthroplasty.
IMPRESSION: Left double-pigtail ureteral stent.

Otherwise unremarkable.

## 2021-10-30 IMAGING — US US PELVIS COMPLETE
1 series · 13 of 25 positions shown · non-contrast
Comparison: CT abdomen/pelvis dated 01/20/2021

CLINICAL DATA: Ovarian mass on CT

EXAM:
TRANSABDOMINAL ULTRASOUND OF PELVIS
DOPPLER ULTRASOUND OF OVARIES
TECHNIQUE: Transabdominal ultrasound examination of the pelvis was performed
including evaluation of the uterus, ovaries, adnexal regions, and
pelvic cul-de-sac.
Color and duplex Doppler ultrasound was utilized to evaluate blood
flow to the ovaries.

[Series 1: us pelvis (transabdominal only) · 40 acquisitions, 13 frames shown]
[im 1/40]
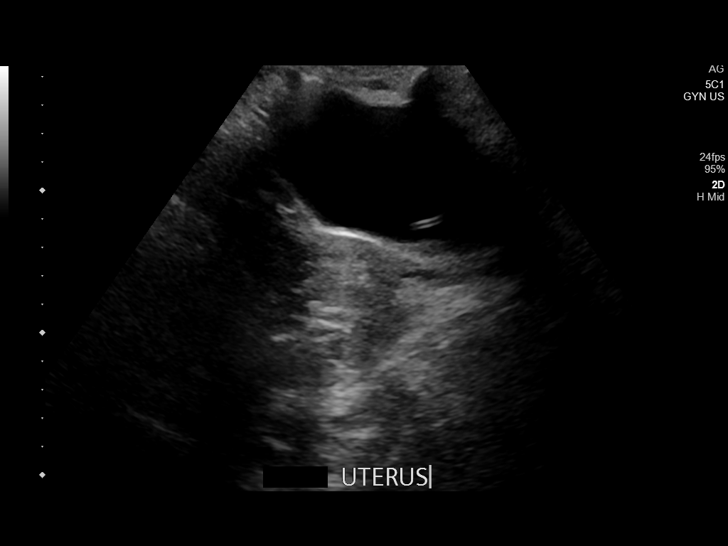
[im 4/40]
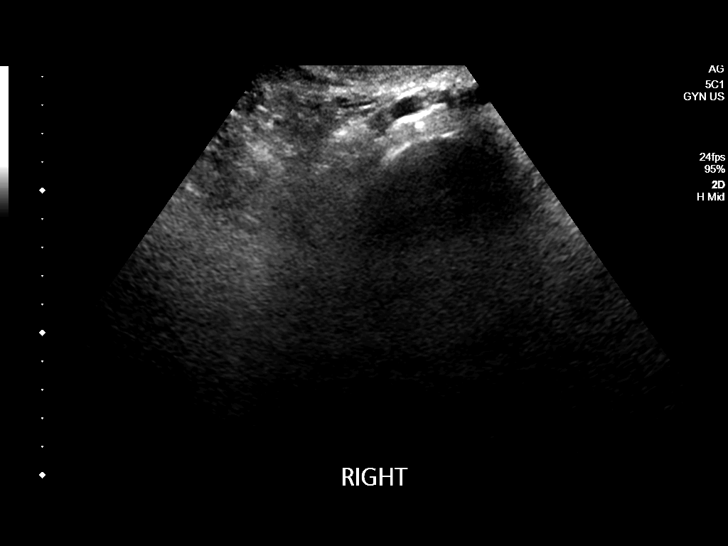
[im 7/40]
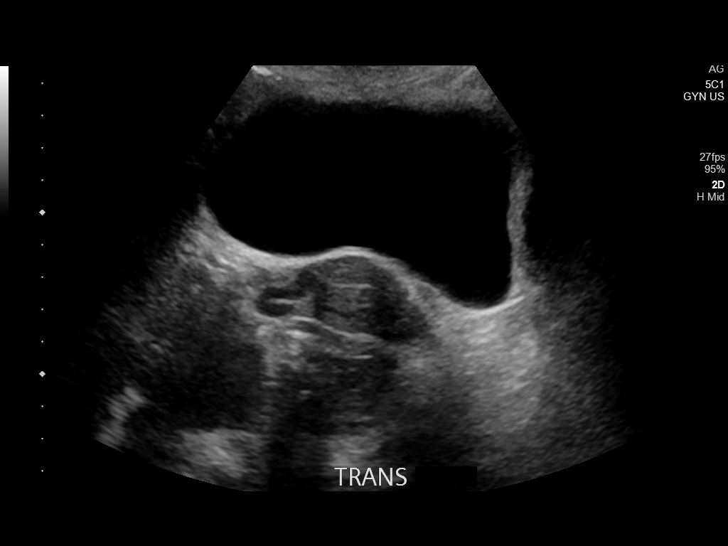
[im 10/40]
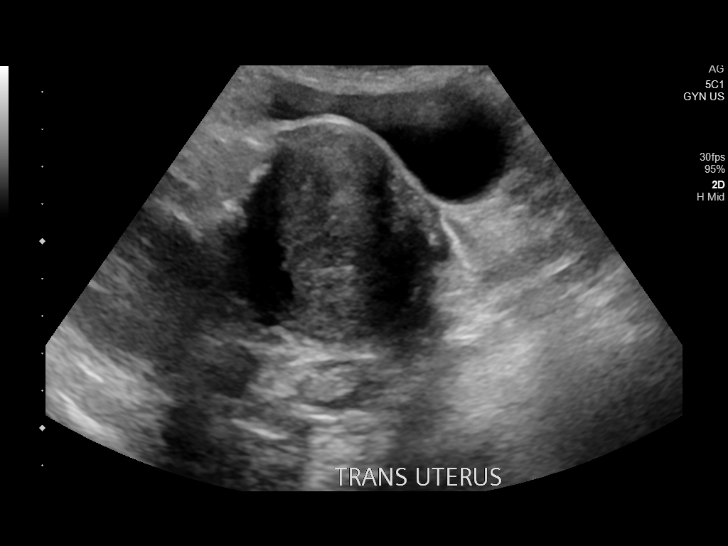
[im 14/40]
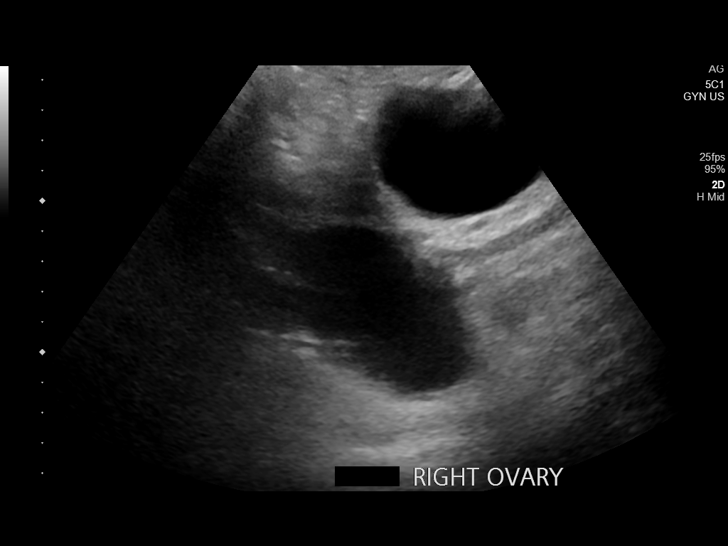
[im 17/40]
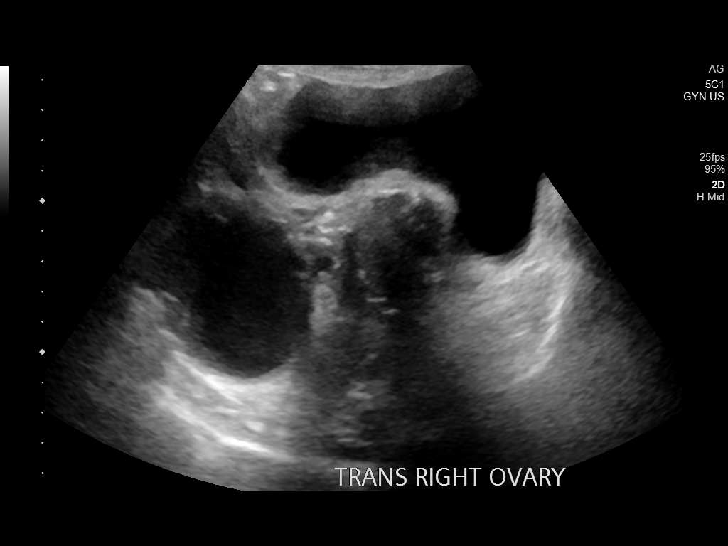
[im 20/40]
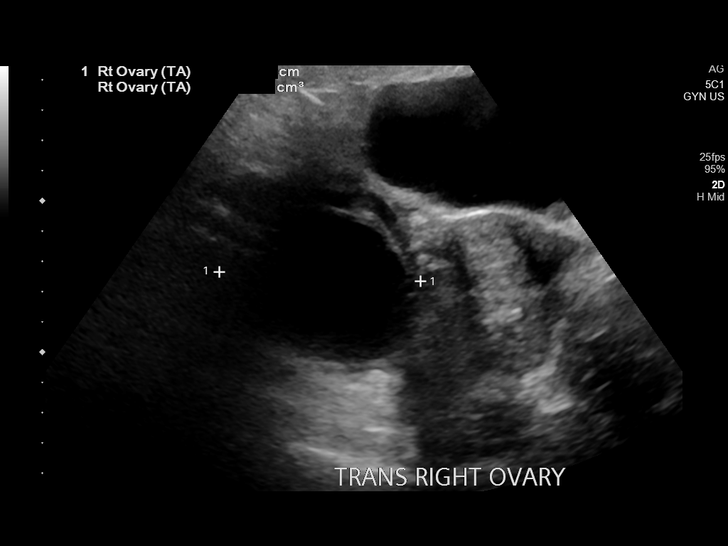
[im 23/40]
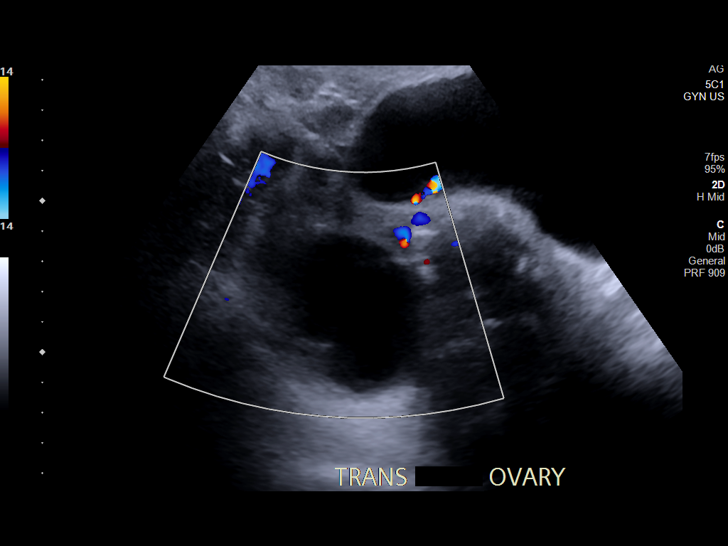
[im 27/40]
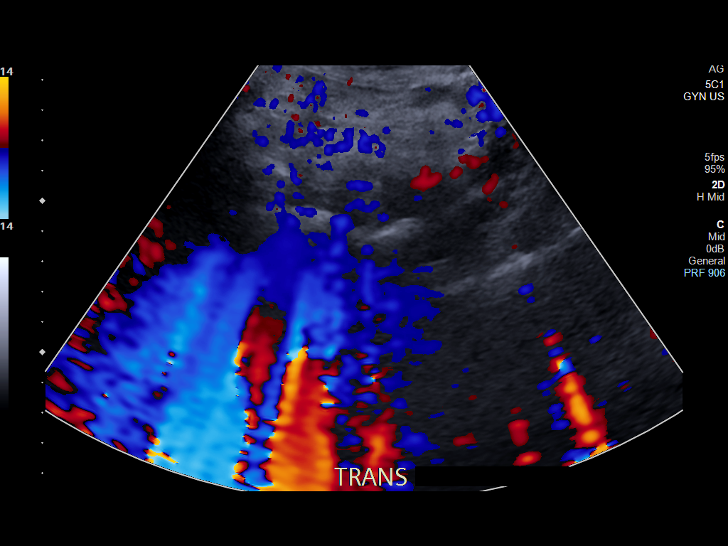
[im 30/40]
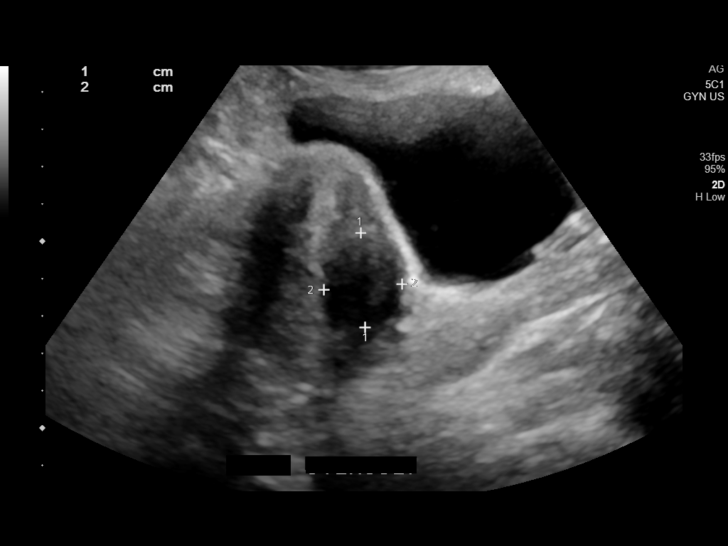
[im 33/40]
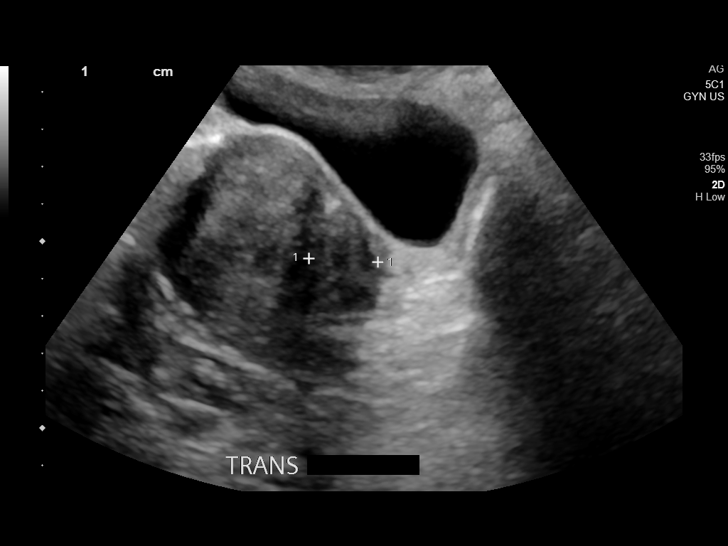
[im 36/40]
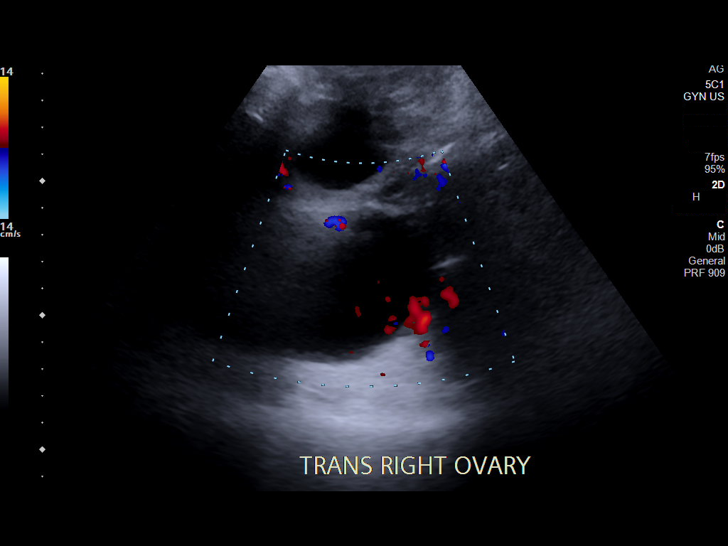
[im 40/40]
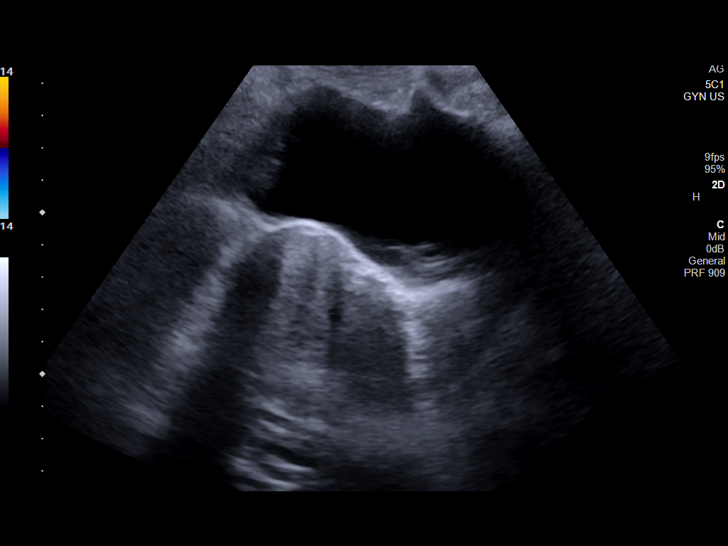

[13 of 25 positions shown; findings below may reference images not displayed]

FINDINGS: Uterus

Measurements: 7.0 x 5.6 x 5.4 cm = volume: 112 mL. 2.5 x 2.1 x
cm intramural fibroid in the left anterior uterine body.

Endometrium

Thickness: 4 mm.  No focal abnormality visualized.

Right ovary

Measurements: 8.8 x 5.6 x 6.7 cm = volume: 173 mL. 7.0 x 4.4 x
cm cystic lesion with incomplete peripheral septation. No mural
nodularity/solid component.

Left ovary

Not discretely visualized.  No adnexal mass is seen.

Pulsed Doppler evaluation demonstrates normal low-resistance
arterial and venous waveforms in both ovaries.

Other: No pelvic ascites.
IMPRESSION: 7.0 cm minimally complex cystic lesion with incomplete peripheral
septation, but without overt malignant features. Given size, a
benign ovarian neoplasm is favored. However, at the patient's age,
it is unclear that aggressive management is required. Consider
follow-up pelvic ultrasound in 1 year if clinically warranted.

Left ovary is not discretely visualized.

No evidence of right ovarian torsion.

## 2021-11-01 IMAGING — DX DG CHEST 1V PORT
1 series · 1 of 1 positions shown · non-contrast
Comparison: 01/22/2021

CLINICAL DATA: Fever and altered mental status.

EXAM:
PORTABLE CHEST 1 VIEW

[chest ap]
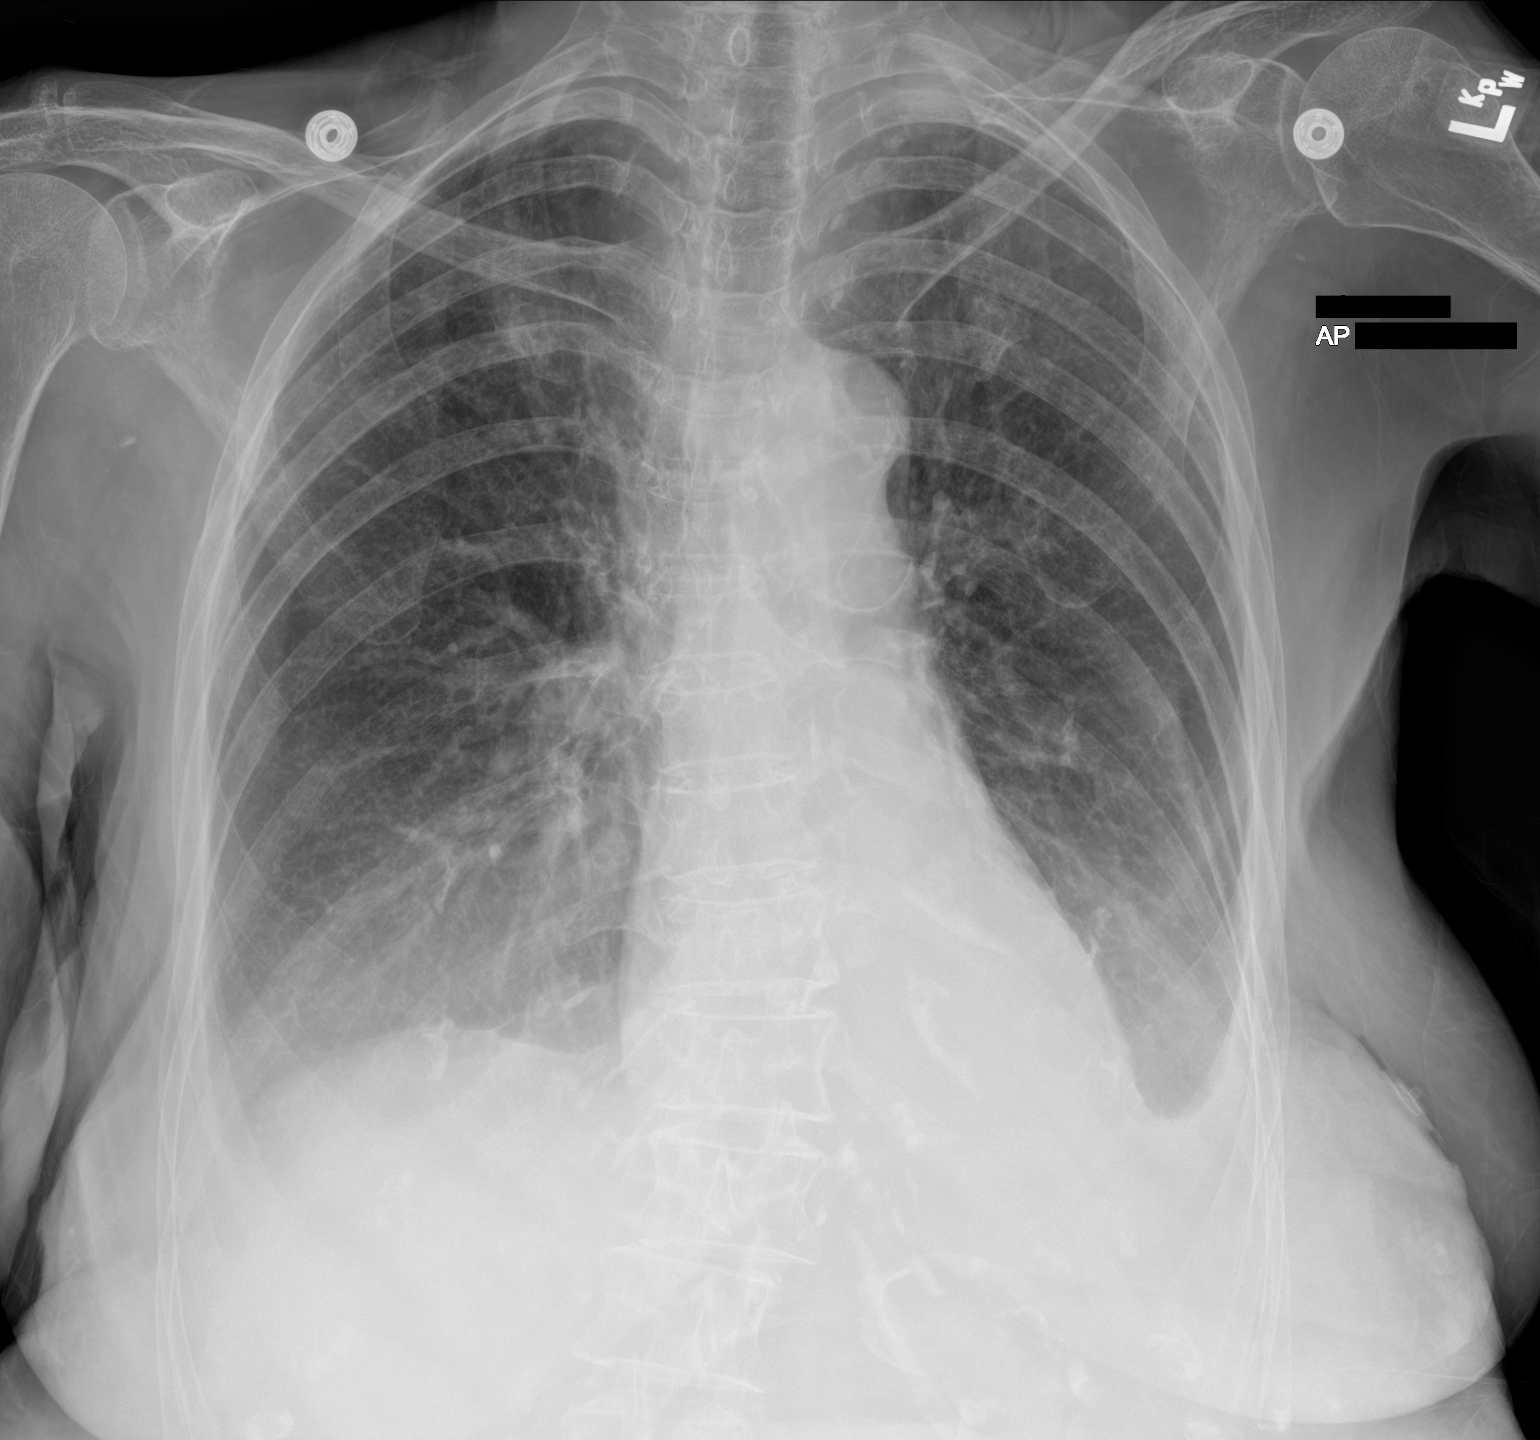

[1 of 1 positions shown; findings below may reference images not displayed]

FINDINGS: 8183 hours. Lungs are hyperexpanded. Bibasilar
collapse/consolidation, similar to prior with bilateral small
pleural effusions, stable. The cardiopericardial silhouette is
within normal limits for size. Bones are diffusely demineralized.
IMPRESSION: Stable. Hyperexpansion with bibasilar atelectasis/infiltrate and
small bilateral pleural effusions.

## 2023-10-27 DEATH — deceased
